# Patient Record
Sex: Male | Born: 1985 | Race: Black or African American | Hispanic: No | Marital: Single | Smoking: Current every day smoker
Health system: Southern US, Community
[De-identification: ages and names within clinical notes are randomized; demographics above are authoritative.]

## PROBLEM LIST (undated history)

## (undated) HISTORY — PX: WRIST SURGERY: SHX841

---

## 2005-08-16 ENCOUNTER — Emergency Department (HOSPITAL_COMMUNITY): Admission: EM | Admit: 2005-08-16 | Discharge: 2005-08-16 | Payer: Self-pay | Admitting: Emergency Medicine

## 2007-02-17 ENCOUNTER — Emergency Department (HOSPITAL_COMMUNITY): Admission: EM | Admit: 2007-02-17 | Discharge: 2007-02-17 | Payer: Self-pay | Admitting: *Deleted

## 2007-04-15 ENCOUNTER — Emergency Department (HOSPITAL_COMMUNITY): Admission: EM | Admit: 2007-04-15 | Discharge: 2007-04-15 | Payer: Self-pay | Admitting: Emergency Medicine

## 2007-05-10 ENCOUNTER — Emergency Department (HOSPITAL_COMMUNITY): Admission: EM | Admit: 2007-05-10 | Discharge: 2007-05-10 | Payer: Self-pay | Admitting: Emergency Medicine

## 2007-05-19 ENCOUNTER — Ambulatory Visit: Payer: Self-pay | Admitting: Internal Medicine

## 2007-05-28 ENCOUNTER — Emergency Department (HOSPITAL_COMMUNITY): Admission: EM | Admit: 2007-05-28 | Discharge: 2007-05-28 | Payer: Self-pay | Admitting: Emergency Medicine

## 2007-05-30 ENCOUNTER — Ambulatory Visit: Payer: Self-pay | Admitting: *Deleted

## 2007-10-06 ENCOUNTER — Ambulatory Visit: Payer: Self-pay | Admitting: Family Medicine

## 2008-02-08 ENCOUNTER — Emergency Department (HOSPITAL_COMMUNITY): Admission: EM | Admit: 2008-02-08 | Discharge: 2008-02-08 | Payer: Self-pay | Admitting: Emergency Medicine

## 2008-05-15 ENCOUNTER — Ambulatory Visit: Payer: Self-pay | Admitting: Internal Medicine

## 2008-05-15 DIAGNOSIS — F319 Bipolar disorder, unspecified: Secondary | ICD-10-CM | POA: Insufficient documentation

## 2008-05-15 LAB — CONVERTED CEMR LAB
Ketones, urine, test strip: NEGATIVE
Nitrite: NEGATIVE
Specific Gravity, Urine: 1.01
pH: 6

## 2008-05-22 LAB — CONVERTED CEMR LAB
ALT: 16 units/L (ref 0–53)
AST: 18 units/L (ref 0–37)
Alkaline Phosphatase: 61 units/L (ref 39–117)
Basophils Absolute: 0 10*3/uL (ref 0.0–0.1)
Basophils Relative: 0 % (ref 0–1)
CO2: 24 meq/L (ref 19–32)
Calcium: 9.2 mg/dL (ref 8.4–10.5)
Chloride: 103 meq/L (ref 96–112)
Creatinine, Ser: 1.3 mg/dL (ref 0.40–1.50)
Eosinophils Absolute: 0.1 10*3/uL (ref 0.0–0.7)
Eosinophils Relative: 1 % (ref 0–5)
Glucose, Bld: 91 mg/dL (ref 70–99)
HCT: 43.6 % (ref 39.0–52.0)
HDL: 41 mg/dL (ref >39)
MCHC: 35.3 g/dL (ref 30.0–36.0)
MCV: 86.3 fL (ref 78.0–100.0)
Monocytes Relative: 9 % (ref 3–12)
Neutro Abs: 3.1 10*3/uL (ref 1.7–7.7)
Neutrophils Relative %: 57 % (ref 43–77)
RBC: 5.05 M/uL (ref 4.22–5.81)
Total CHOL/HDL Ratio: 3.8
Triglycerides: 286 mg/dL — ABNORMAL HIGH (ref <150)
VLDL: 57 mg/dL — ABNORMAL HIGH (ref 0–40)

## 2009-01-03 ENCOUNTER — Ambulatory Visit: Payer: Self-pay | Admitting: Internal Medicine

## 2009-01-03 DIAGNOSIS — E781 Pure hyperglyceridemia: Secondary | ICD-10-CM

## 2009-01-03 LAB — CONVERTED CEMR LAB
Total CHOL/HDL Ratio: 4.3
Triglycerides: 372 mg/dL — ABNORMAL HIGH (ref <150)

## 2009-01-10 ENCOUNTER — Encounter (INDEPENDENT_AMBULATORY_CARE_PROVIDER_SITE_OTHER): Payer: Self-pay | Admitting: Internal Medicine

## 2010-04-03 ENCOUNTER — Ambulatory Visit: Payer: Self-pay | Admitting: Internal Medicine

## 2010-04-03 LAB — CONVERTED CEMR LAB
AST: 24 units/L (ref 0–37)
BUN: 16 mg/dL (ref 6–23)
Basophils Absolute: 0 10*3/uL (ref 0.0–0.1)
CO2: 25 meq/L (ref 19–32)
Chloride: 104 meq/L (ref 96–112)
Creatinine, Ser: 1.11 mg/dL (ref 0.40–1.50)
Eosinophils Absolute: 0 10*3/uL (ref 0.0–0.7)
GC Probe Amp, Urine: NEGATIVE
HCT: 45 % (ref 39.0–52.0)
HDL: 54 mg/dL (ref >39)
LDL Cholesterol: 71 mg/dL (ref 0–99)
Lymphocytes Relative: 18 % (ref 12–46)
MCV: 86.5 fL (ref 78.0–100.0)
Neutro Abs: 7.4 10*3/uL (ref 1.7–7.7)
Neutrophils Relative %: 77 % (ref 43–77)
Platelets: 269 10*3/uL (ref 150–400)
RDW: 12.4 % (ref 11.5–15.5)
Total Bilirubin: 0.5 mg/dL (ref 0.3–1.2)
Total Protein: 7.2 g/dL (ref 6.0–8.3)
Triglycerides: 190 mg/dL — ABNORMAL HIGH (ref <150)
WBC: 9.6 10*3/uL (ref 4.0–10.5)

## 2010-04-08 ENCOUNTER — Encounter (INDEPENDENT_AMBULATORY_CARE_PROVIDER_SITE_OTHER): Payer: Self-pay | Admitting: Internal Medicine

## 2010-04-14 ENCOUNTER — Encounter (INDEPENDENT_AMBULATORY_CARE_PROVIDER_SITE_OTHER): Payer: Self-pay | Admitting: Internal Medicine

## 2010-04-27 ENCOUNTER — Encounter (INDEPENDENT_AMBULATORY_CARE_PROVIDER_SITE_OTHER): Payer: Self-pay | Admitting: Internal Medicine

## 2010-07-29 ENCOUNTER — Ambulatory Visit: Admit: 2010-07-29 | Payer: Self-pay | Admitting: Internal Medicine

## 2010-08-08 ENCOUNTER — Encounter (INDEPENDENT_AMBULATORY_CARE_PROVIDER_SITE_OTHER): Payer: Self-pay | Admitting: Internal Medicine

## 2010-08-12 NOTE — Letter (Signed)
Summary: REQUESTING RECORDS FROM [facility]  REQUESTING RECORDS FROM [facility]   Imported By: Arta Bruce 04/09/2010 12:42:55  _____________________________________________________________________  External Attachment:    Type:   Image     Comment:   External Document

## 2010-08-12 NOTE — Letter (Signed)
Summary: Lipid Letter  [facility]  [address] [place], [place] [address]   Phone: [phone]  Fax: [phone]    04/27/2010  Horice Carrero [address] [place], [place]  [address]  Dear Ines Bloomer:  We have carefully reviewed your last lipid profile from 04/03/2010 and the results are noted below with a summary of recommendations for lipid management.    Cholesterol:       163     Goal: <200   HDL "good" Cholesterol:   54     Goal: >45   LDL "bad" Cholesterol:   71     Goal: <100   Triglycerides:       190     Goal: <150    Your triglycerides are still slightly high, but much much better than before--keep working on diet and exercise.  The rest of your labs were fine.  All of you STD testing was negative for infection.    TLC Diet (Therapeutic Lifestyle Change): Saturated Fats & Transfatty acids should be kept < 7% of total calories ***Reduce Saturated Fats Polyunstaurated Fat can be up to 10% of total calories Monounsaturated Fat Fat can be up to 20% of total calories Total Fat should be no greater than 25-35% of total calories Carbohydrates should be 50-60% of total calories Protein should be approximately 15% of total calories Fiber should be at least 20-30 grams a day ***Increased fiber may help lower LDL Total Cholesterol should be < 200mg /day Consider adding plant stanol/sterols to diet (example: Benacol spread) ***A higher intake of unsaturated fat may reduce Triglycerides and Increase HDL    Adjunctive Measures (may lower LIPIDS and reduce risk of Heart Attack) include: Aerobic Exercise (20-30 minutes 3-4 times a week) Limit Alcohol Consumption Weight Reduction Aspirin 75-81 mg a day by mouth (if not allergic or contraindicated) Dietary Fiber 20-30 grams a day by mouth     Current Medications: 1)    Seroquel Xr 400 Mg Xr24h-tab (Quetiapine fumarate) .... 2 tabs by mouth q 9 pm on an empty stomach--[facility] ?dr. Mila Homer 2)     Budeprion Xl 300 Mg Xr24h-tab (Bupropion hcl) .Marland Kitchen.. 1 tab by mouth qam ?dr. Mila Homer 3)    Depakote Er 500 Mg Xr24h-tab (Divalproex sodium) .... 3 tabs by mouth at bedtime ? dr. Mila Homer  If you have any questions, please call. We appreciate being able to work with you.   Sincerely,    [facility] Julieanne Manson MD

## 2010-08-12 NOTE — Assessment & Plan Note (Signed)
Summary: TO RENEW MEDS///KT   Vital Signs:  Patient profile:   25 year old male Height:      75 inches Weight:      210 pounds BMI:     26.34 Temp:     97.9 degrees F Pulse rate:   80 / minute Pulse rhythm:   regular Resp:     20 per minute BP sitting:   116 / 80  (left arm)  Vitals Entered By: CMA Student Linzie Collin CC: office visit to renew medications, patients states that he has a lump on bottom of both feet associated with pain Is Patient Diabetic? No Pain Assessment Patient in pain? no       Does patient need assistance? Functional Status Self care Ambulation Normal   CC:  office visit to renew medications and patients states that he has a lump on bottom of both feet associated with pain.  History of Present Illness: 1.  Weight Loss:  has been much more physically active.    2.  Would like to be tested for STDs.  With a new girlfriend.  Not having any symptoms of penile discharge or genital lesions.  Girlfriend was treated previously for what sounds like BV.  3.  Foot abnormality:  Referred to [facility] last year--pt. states he was told the bumps are there secondary to the shoes he was wearing. Pt. cannot recall all of the instructions and sounds like he did not follow any instructions.  No improvement.  Hurts when bearing weight or if pressure on the area.  Current Medications (verified): 1)  Seroquel Xr 400 Mg Xr24h-Tab (Quetiapine Fumarate) .... 2 Tabs By Mouth Q 9 Pm On An Empty Stomach--[facility] ?dr. Mila Homer 2)  Budeprion Xl 300 Mg Xr24h-Tab (Bupropion Hcl) .Marland Kitchen.. 1 Tab By Mouth Qam ?dr. Mila Homer 3)  Depakote Er 500 Mg Xr24h-Tab (Divalproex Sodium) .... 2 Tabs By Mouth At Bedtime ? Dr. Mila Homer  Allergies (verified): No Known Drug Allergies  Physical Exam  General:  NAD--looks healthy Lungs:  Normal respiratory effort, chest expands symmetrically. Lungs are clear to auscultation, no crackles or wheezes. Heart:  Normal rate and regular rhythm. S1  and S2 normal without gallop, murmur, click, rub or other extra sounds. Extremities:  Soft tissue enlargement at mid arch, plantar foot.  No erythema.  May surround a tendon--difficult to tell.  Mildly tender.   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 2 tabs by mouth q 9 pm on an empty stomach--[facility] ?dr. Mila Homer 2)  Budeprion Xl 300 Mg Xr24h-tab (Bupropion hcl) .Marland Kitchen.. 1 tab by mouth qam ?dr. Mila Homer 3)  Depakote Er 500 Mg Xr24h-tab (Divalproex sodium) .... 3 tabs by mouth at bedtime ? dr. Mila Homer  Other Orders: T-Comprehensive Metabolic Panel [id number]) T-CBC w/Diff [id number]) T-Lipid Profile ([id number])  Patient Instructions: 1)  Release of infor from [facility]--was seen there last year., 2)  CPE in 4 months with Dr. Delrae Alfred  Appended Document: TO RENEW MEDS///KT    Clinical Lists Changes  Observations: Added new observation of HIVRAPIDRSLT: negative (04/03/2010 16:02)  Laboratory Results    Other Tests  Rapid HIV: negative

## 2010-08-14 NOTE — Letter (Signed)
Summary: MAILED REQUESTED RECORDS TO [facility]  MAILED REQUESTED RECORDS TO [facility]   Imported By: Arta Bruce 08/08/2010 15:58:50  _____________________________________________________________________  External Attachment:    Type:   Image     Comment:   External Document

## 2010-08-24 ENCOUNTER — Emergency Department (HOSPITAL_COMMUNITY)
Admission: EM | Admit: 2010-08-24 | Discharge: 2010-08-24 | Disposition: A | Discharge status: Home / Self Care | Payer: Medicaid Other | Attending: Emergency Medicine | Admitting: Emergency Medicine

## 2010-08-24 DIAGNOSIS — S61209A Unspecified open wound of unspecified finger without damage to nail, initial encounter: Secondary | ICD-10-CM | POA: Insufficient documentation

## 2010-08-24 DIAGNOSIS — W268XXA Contact with other sharp object(s), not elsewhere classified, initial encounter: Secondary | ICD-10-CM | POA: Insufficient documentation

## 2010-08-24 DIAGNOSIS — F319 Bipolar disorder, unspecified: Secondary | ICD-10-CM | POA: Insufficient documentation

## 2010-08-24 DIAGNOSIS — K219 Gastro-esophageal reflux disease without esophagitis: Secondary | ICD-10-CM | POA: Insufficient documentation

## 2010-08-24 DIAGNOSIS — Y92009 Unspecified place in unspecified non-institutional (private) residence as the place of occurrence of the external cause: Secondary | ICD-10-CM | POA: Insufficient documentation

## 2010-09-23 NOTE — Letter (Signed)
Summary: REQUESTING RECORDS/[facility]/NO RECORDS  REQUESTING RECORDS/[facility]/NO RECORDS   Imported By: Arta Bruce 09/15/2010 09:50:01  _____________________________________________________________________  External Attachment:    Type:   Image     Comment:   External Document

## 2011-04-21 LAB — URINALYSIS, ROUTINE W REFLEX MICROSCOPIC
Bilirubin Urine: NEGATIVE
Specific Gravity, Urine: 1.017
Urobilinogen, UA: 0.2
pH: 6

## 2011-04-21 LAB — BASIC METABOLIC PANEL
CO2: 27
Chloride: 104
Creatinine, Ser: 1.4
GFR calc non Af Amer: >60
Glucose, Bld: 104 — ABNORMAL HIGH
Potassium: 4
Sodium: 136

## 2011-04-21 LAB — DIFFERENTIAL
Basophils Relative: 0
Eosinophils Relative: 2
Monocytes Absolute: 0.5
Monocytes Relative: 8

## 2011-04-21 LAB — CBC
HCT: 39.4
MCHC: 35.2
MCV: 84.9
RDW: 13.2
WBC: 6.3

## 2011-04-22 LAB — RPR: RPR Ser Ql: NONREACTIVE

## 2011-04-23 LAB — URINALYSIS, ROUTINE W REFLEX MICROSCOPIC
Bilirubin Urine: NEGATIVE
Glucose, UA: NEGATIVE
Leukocytes, UA: NEGATIVE
Specific Gravity, Urine: 1.014
Urobilinogen, UA: 1
pH: 6

## 2011-04-23 LAB — URINE MICROSCOPIC-ADD ON

## 2011-04-23 LAB — DIFFERENTIAL
Basophils Relative: 0
Eosinophils Absolute: 0.1
Eosinophils Relative: 1
Lymphocytes Relative: 21
Lymphs Abs: 1.7
Monocytes Absolute: 0.5
Neutro Abs: 5.7

## 2011-04-23 LAB — COMPREHENSIVE METABOLIC PANEL
AST: 21
Alkaline Phosphatase: 59
BUN: 11
Calcium: 9
Chloride: 100
GFR calc Af Amer: >60
GFR calc non Af Amer: >60
Potassium: 3.9

## 2011-04-23 LAB — URINE CULTURE

## 2011-04-23 LAB — CBC
HCT: 42.5
MCHC: 34.3
MCV: 85.7
Platelets: 253
RBC: 4.96

## 2013-03-16 ENCOUNTER — Emergency Department (HOSPITAL_COMMUNITY): Payer: No Typology Code available for payment source

## 2013-03-16 ENCOUNTER — Encounter (HOSPITAL_COMMUNITY): Payer: Self-pay | Admitting: Adult Health

## 2013-03-16 ENCOUNTER — Emergency Department (HOSPITAL_COMMUNITY)
Admission: EM | Admit: 2013-03-16 | Discharge: 2013-03-16 | Disposition: A | Discharge status: Home / Self Care | Payer: No Typology Code available for payment source | Attending: Emergency Medicine | Admitting: Emergency Medicine

## 2013-03-16 DIAGNOSIS — Z79899 Other long term (current) drug therapy: Secondary | ICD-10-CM | POA: Insufficient documentation

## 2013-03-16 DIAGNOSIS — M546 Pain in thoracic spine: Secondary | ICD-10-CM

## 2013-03-16 DIAGNOSIS — Y929 Unspecified place or not applicable: Secondary | ICD-10-CM | POA: Insufficient documentation

## 2013-03-16 DIAGNOSIS — R109 Unspecified abdominal pain: Secondary | ICD-10-CM

## 2013-03-16 DIAGNOSIS — IMO0002 Reserved for concepts with insufficient information to code with codable children: Secondary | ICD-10-CM | POA: Insufficient documentation

## 2013-03-16 DIAGNOSIS — S161XXA Strain of muscle, fascia and tendon at neck level, initial encounter: Secondary | ICD-10-CM

## 2013-03-16 DIAGNOSIS — S3981XA Other specified injuries of abdomen, initial encounter: Secondary | ICD-10-CM | POA: Insufficient documentation

## 2013-03-16 DIAGNOSIS — F17200 Nicotine dependence, unspecified, uncomplicated: Secondary | ICD-10-CM | POA: Insufficient documentation

## 2013-03-16 DIAGNOSIS — Y9389 Activity, other specified: Secondary | ICD-10-CM | POA: Insufficient documentation

## 2013-03-16 DIAGNOSIS — S139XXA Sprain of joints and ligaments of unspecified parts of neck, initial encounter: Secondary | ICD-10-CM | POA: Insufficient documentation

## 2013-03-16 MED ORDER — NAPROXEN 500 MG PO TABS: 500.0000 mg | ORAL_TABLET | Freq: Two times a day (BID) | ORAL | Status: DC

## 2013-03-16 MED ORDER — IOHEXOL 300 MG/ML  SOLN
100.0000 mL | Freq: Once | INTRAMUSCULAR | Status: AC | PRN
  Administered 2013-03-16: 100 mL via INTRAVENOUS

## 2013-03-16 MED ORDER — HYDROCODONE-ACETAMINOPHEN 5-325 MG PO TABS: 1.0000 | ORAL_TABLET | Freq: Four times a day (QID) | ORAL | Status: DC | PRN

## 2013-03-16 MED ORDER — CYCLOBENZAPRINE HCL 10 MG PO TABS: 10.0000 mg | ORAL_TABLET | Freq: Three times a day (TID) | ORAL | Status: DC | PRN
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
## 2013-03-16 NOTE — ED Notes (Signed)
Pt has ride home.

## 2013-03-17 NOTE — ED Provider Notes (Signed)
Medical screening examination/treatment/procedure(s) were performed by non-physician practitioner and as supervising physician I was immediately available for consultation/collaboration.  Flint Melter, MD 03/17/13 [phone]

## 2013-12-24 ENCOUNTER — Emergency Department (HOSPITAL_COMMUNITY)
Admission: EM | Admit: 2013-12-24 | Discharge: 2013-12-24 | Disposition: A | Discharge status: Home / Self Care | Payer: Medicaid Other | Attending: Emergency Medicine | Admitting: Emergency Medicine

## 2013-12-24 ENCOUNTER — Emergency Department (HOSPITAL_COMMUNITY): Payer: Medicaid Other

## 2013-12-24 ENCOUNTER — Encounter (HOSPITAL_COMMUNITY): Payer: Self-pay | Admitting: Emergency Medicine

## 2013-12-24 DIAGNOSIS — Z79899 Other long term (current) drug therapy: Secondary | ICD-10-CM | POA: Insufficient documentation

## 2013-12-24 DIAGNOSIS — L0231 Cutaneous abscess of buttock: Secondary | ICD-10-CM | POA: Insufficient documentation

## 2013-12-24 DIAGNOSIS — F17200 Nicotine dependence, unspecified, uncomplicated: Secondary | ICD-10-CM | POA: Insufficient documentation

## 2013-12-24 DIAGNOSIS — L03317 Cellulitis of buttock: Secondary | ICD-10-CM

## 2013-12-24 LAB — BASIC METABOLIC PANEL
BUN: 12 mg/dL (ref 6–23)
CALCIUM: 9.3 mg/dL (ref 8.4–10.5)
CHLORIDE: 101 meq/L (ref 96–112)
CO2: 26 meq/L (ref 19–32)
Creatinine, Ser: 1.07 mg/dL (ref 0.50–1.35)
GFR calc Af Amer: >90 mL/min (ref >90)
GFR calc non Af Amer: >90 mL/min (ref >90)
Glucose, Bld: 106 mg/dL — ABNORMAL HIGH (ref 70–99)
POTASSIUM: 4.1 meq/L (ref 3.7–5.3)
SODIUM: 140 meq/L (ref 137–147)

## 2013-12-24 LAB — CBC WITH DIFFERENTIAL/PLATELET
BASOS ABS: 0 10*3/uL (ref 0.0–0.1)
Basophils Relative: 0 % (ref 0–1)
EOS PCT: 0 % (ref 0–5)
Eosinophils Absolute: 0 10*3/uL (ref 0.0–0.7)
HCT: 39.9 % (ref 39.0–52.0)
Hemoglobin: 13.7 g/dL (ref 13.0–17.0)
LYMPHS ABS: 1 10*3/uL (ref 0.7–4.0)
LYMPHS PCT: 6 % — AB (ref 12–46)
MCH: 29 pg (ref 26.0–34.0)
MCHC: 34.3 g/dL (ref 30.0–36.0)
MCV: 84.5 fL (ref 78.0–100.0)
Monocytes Absolute: 1.4 10*3/uL — ABNORMAL HIGH (ref 0.1–1.0)
Monocytes Relative: 9 % (ref 3–12)
NEUTROS ABS: 13.5 10*3/uL — AB (ref 1.7–7.7)
NEUTROS PCT: 85 % — AB (ref 43–77)
PLATELETS: 234 10*3/uL (ref 150–400)
RBC: 4.72 MIL/uL (ref 4.22–5.81)
RDW: 12.4 % (ref 11.5–15.5)
WBC: 16 10*3/uL — AB (ref 4.0–10.5)

## 2013-12-24 MED ORDER — MORPHINE SULFATE 4 MG/ML IJ SOLN
4.0000 mg | Freq: Once | INTRAMUSCULAR | Status: DC
  Filled 2013-12-24: qty 1

## 2013-12-24 MED ORDER — SODIUM CHLORIDE 0.9 % IV BOLUS (SEPSIS)
1000.0000 mL | Freq: Once | INTRAVENOUS | Status: AC
  Administered 2013-12-24: 1000 mL via INTRAVENOUS

## 2013-12-24 MED ORDER — CLINDAMYCIN HCL 300 MG PO CAPS
300.0000 mg | ORAL_CAPSULE | Freq: Once | ORAL | Status: AC
  Administered 2013-12-24: 300 mg via ORAL
  Filled 2013-12-24: qty 1

## 2013-12-24 MED ORDER — OXYCODONE-ACETAMINOPHEN 5-325 MG PO TABS
2.0000 | ORAL_TABLET | Freq: Once | ORAL | Status: AC
  Administered 2013-12-24: 2 via ORAL
  Filled 2013-12-24: qty 2

## 2013-12-24 MED ORDER — MORPHINE SULFATE 4 MG/ML IJ SOLN
4.0000 mg | Freq: Once | INTRAMUSCULAR | Status: AC
  Administered 2013-12-24: 4 mg via INTRAVENOUS
  Filled 2013-12-24: qty 1

## 2013-12-24 MED ORDER — OXYCODONE-ACETAMINOPHEN 5-325 MG PO TABS: 2.0000 | ORAL_TABLET | ORAL | Status: DC | PRN

## 2013-12-24 MED ORDER — IOHEXOL 300 MG/ML  SOLN
100.0000 mL | Freq: Once | INTRAMUSCULAR | Status: AC | PRN
  Administered 2013-12-24: 100 mL via INTRAVENOUS

## 2013-12-24 MED ORDER — CLINDAMYCIN HCL 150 MG PO CAPS: 300.0000 mg | ORAL_CAPSULE | Freq: Three times a day (TID) | ORAL | Status: DC

## 2013-12-24 MED ORDER — ONDANSETRON HCL 4 MG/2ML IJ SOLN
4.0000 mg | Freq: Once | INTRAMUSCULAR | Status: AC
  Administered 2013-12-24: 4 mg via INTRAVENOUS
  Filled 2013-12-24: qty 2

## 2013-12-24 NOTE — ED Provider Notes (Signed)
CSN: [id number]     Arrival date & time 12/24/13  0320 History   First MD Initiated Contact with Patient 12/24/13 [phone]     Chief Complaint  Patient presents with  . boil      (Consider location/radiation/quality/duration/timing/severity/associated sxs/prior Treatment) HPI Comments: Patient is a 28 year old male with a past medical history of bipolar disorder who presents with a "boil" on his buttock for 2-3 days. Patient states he first noticed the area when he was showering. The area is painful with a throbbing and severe pain that radiates to his right hip. The pain started gradually and progressively worsened since the onset. Palpation makes the pain worse. No alleviating factors. Patient reports associated fevers.    History reviewed. No pertinent past medical history. History reviewed. No pertinent past surgical history. No family history on file. History  Substance Use Topics  . Smoking status: Current Every Day Smoker    Types: Cigarettes  . Smokeless tobacco: Not on file  . Alcohol Use: No    Review of Systems  Constitutional: Negative for fever, chills and fatigue.  HENT: Negative for trouble swallowing.   Eyes: Negative for visual disturbance.  Respiratory: Negative for shortness of breath.   Cardiovascular: Negative for chest pain and palpitations.  Gastrointestinal: Negative for nausea, vomiting, abdominal pain and diarrhea.  Genitourinary: Negative for dysuria and difficulty urinating.  Musculoskeletal: Negative for arthralgias and neck pain.  Skin: Positive for wound. Negative for color change.  Neurological: Negative for dizziness and weakness.  Psychiatric/Behavioral: Negative for dysphoric mood.      Allergies  Review of patient's allergies indicates no known allergies.  Home Medications   Prior to Admission medications   Medication Sig Start Date End Date Taking? Authorizing Provider  amphetamine-dextroamphetamine (ADDERALL) 10 MG tablet Take 10 mg by  mouth daily. Only takes when going to school Monday- Friday.   Yes Historical Provider, MD  buPROPion (WELLBUTRIN) 100 MG tablet Take 200 mg by mouth daily.   Yes Historical Provider, MD  Valproate Sodium (DEPAKENE PO) Take 400 mg by mouth 2 (two) times daily.    Yes Historical Provider, MD   BP 113/66  Pulse 75  Temp(Src) 99.6 F (37.6 C) (Oral)  Resp 14  SpO2 97% Physical Exam  Nursing note and vitals reviewed. Constitutional: He is oriented to person, place, and time. He appears well-developed and well-nourished. No distress.  HENT:  Head: Normocephalic and atraumatic.  Eyes: Conjunctivae and EOM are normal.  Neck: Normal range of motion.  Cardiovascular: Normal rate and regular rhythm.  Exam reveals no gallop and no friction rub.   No murmur heard. Pulmonary/Chest: Effort normal and breath sounds normal. He has no wheezes. He has no rales. He exhibits no tenderness.  Abdominal: Soft. He exhibits no distension. There is no tenderness. There is no rebound and no guarding.  Genitourinary: Rectum normal.  No abscess appreciated near rectum.   Musculoskeletal: Normal range of motion.  Neurological: He is alert and oriented to person, place, and time. Coordination normal.  Speech is goal-oriented. Moves limbs without ataxia.   Skin: Skin is warm and dry.  Right buttock induration, warmth and tenderness to palpation that extends to his right hip. No fluctuance noted. No drainage noted. There is a small area of broken skin over the right buttock where the patient attempted to drain the area.   Psychiatric: He has a normal mood and affect. His behavior is normal.    ED Course  Procedures (including critical  care time) Labs Review Labs Reviewed  CBC WITH DIFFERENTIAL - Abnormal; Notable for the following:    WBC 16.0 (*)    Neutrophils Relative % 85 (*)    Neutro Abs 13.5 (*)    Lymphocytes Relative 6 (*)    Monocytes Absolute 1.4 (*)    All other components within normal limits   BASIC METABOLIC PANEL - Abnormal; Notable for the following:    Glucose, Bld 106 (*)    All other components within normal limits    Imaging Review Ct Pelvis W Contrast  12/24/2013   CLINICAL DATA:  Evaluate for abscess  EXAM: CT PELVIS WITH CONTRAST  TECHNIQUE: Multidetector CT imaging of the pelvis was performed using the standard protocol following the bolus administration of intravenous contrast.  CONTRAST:  100mL OMNIPAQUE IOHEXOL 300 MG/ML  SOLN  COMPARISON:  03/16/2013  FINDINGS: There is asymmetric skin thickening and subcutaneous fat stranding involving the right gluteal fold, image 52/series 2. There is no underlying abscess.  The urinary bladder appears normal. The prostate gland and seminal vesicles are on unremarkable.  The abdominal aorta appears normal. No lower abdominal adenopathy. Right inguinal lymph node measures 1.3 cm. Review of the visualized bony structures is on unremarkable.  IMPRESSION: 1. Cellulitis involves the right gluteal fold. No underlying abscess. 2. Right inguinal adenopathy, likely reactive.   Electronically Signed   By: Signa Kellaylor  Stroud M.D.   On: 12/24/2013 08:29     EKG Interpretation None      MDM   Final diagnoses:  Cellulitis of buttock, right    6:20 AM Labs pending. Patient will have morphine and zofran for symptoms. CT pelvis pending to rule out deep infection. Patient is tachycardic with a low grade temp on arrival. Vitals stable and patient afebrile currently.   9:12 AM Patient's labs show elevated WBC at 16. CT shows cellulitis of right gluteal fold with no abscess. Vitals stable and patient afebrile. Patient will have first dose of PO clindamycin here. Patient will have additional IV pain medication here. Patient will be discharged with clindamycin and percocet for pain. Patient advised to return with worsening or concerning symptoms.    Emilia BeckKaitlyn Britany Callicott, PA-C 12/24/13 1526

## 2013-12-24 NOTE — ED Notes (Signed)
Boil has been present for 2-3 days

## 2013-12-24 NOTE — ED Notes (Signed)
The pt is c/o a boil near his rectum for 203 days,  Becoming more painful

## 2013-12-24 NOTE — Discharge Instructions (Signed)
Take Clindamycin as directed until gone. Take Percocet as needed for pain. Refer to attached documents for more information. Return to the ED with worsening or concerning symptoms.

## 2013-12-25 NOTE — ED Provider Notes (Signed)
Medical screening examination/treatment/procedure(s) were performed by non-physician practitioner and as supervising physician I was immediately available for consultation/collaboration.   EKG Interpretation None       Olivia Mackielga M Rance Smithson, MD 12/25/13 [phone]

## 2016-02-18 ENCOUNTER — Emergency Department (HOSPITAL_COMMUNITY)
Admission: EM | Admit: 2016-02-18 | Discharge: 2016-02-18 | Disposition: A | Discharge status: Home / Self Care | Payer: Medicare Other | Attending: Emergency Medicine | Admitting: Emergency Medicine

## 2016-02-18 ENCOUNTER — Encounter (HOSPITAL_COMMUNITY): Payer: Self-pay

## 2016-02-18 DIAGNOSIS — H6692 Otitis media, unspecified, left ear: Secondary | ICD-10-CM | POA: Diagnosis not present

## 2016-02-18 DIAGNOSIS — F17210 Nicotine dependence, cigarettes, uncomplicated: Secondary | ICD-10-CM | POA: Diagnosis not present

## 2016-02-18 DIAGNOSIS — H9202 Otalgia, left ear: Secondary | ICD-10-CM | POA: Diagnosis present

## 2016-02-18 MED ORDER — AMOXICILLIN 500 MG PO CAPS: 500.0000 mg | ORAL_CAPSULE | Freq: Three times a day (TID) | ORAL | 0 refills | Status: DC

## 2016-02-18 MED ORDER — IBUPROFEN 800 MG PO TABS: 800.0000 mg | ORAL_TABLET | Freq: Three times a day (TID) | ORAL | 0 refills | Status: DC | PRN

## 2016-02-18 NOTE — ED Triage Notes (Signed)
Sharp left ear pain with muffled hearing after blowing nose hard. Pt states he tried getting ear to pop but was not successful

## 2016-02-18 NOTE — Discharge Instructions (Signed)
Return here as needed. Follow up with a primary doctor. °

## 2016-02-18 NOTE — ED Provider Notes (Signed)
[facility] Provider Note   CSN: [id number] Arrival date & time: 02/18/16  0608  First Provider Contact:  None       History   Chief Complaint Chief Complaint  Patient presents with  . Otalgia    HPI Kevin Gentry is a 30 y.o. male.  HPI Patient presents to the emergency department with left-sided ear pain.  The patient states the symptoms started 1 day ago.  The patient states last night he had worsening pain that woke him from sleep.  Patient states he did not take any medications prior to arrival.  Nothing seems make the condition better or worse.  Patient, states he has had a recent upper respiratory illness. The patient denies chest pain, shortness of breath, headache,blurred vision, neck pain, fever, cough, weakness, numbness, dizziness, anorexia, edema, abdominal pain, nausea, vomiting, diarrhea, rash, back pain, dysuria, hematemesis, bloody stool, near syncope, or syncope. History reviewed. No pertinent past medical history.  Patient Active Problem List   Diagnosis Date Noted  . HYPERTRIGLYCERIDEMIA 01/03/2009  . BIPOLAR DISORDER UNSPECIFIED 05/15/2008    History reviewed. No pertinent surgical history.     Home Medications    Prior to Admission medications   Not on File    Family History No family history on file.  Social History Social History  Substance Use Topics  . Smoking status: Current Every Day Smoker    Types: Cigarettes  . Smokeless tobacco: Never Used  . Alcohol use No     Allergies   Review of patient's allergies indicates no known allergies.   Review of Systems Review of Systems All other systems negative except as documented in the HPI. All pertinent positives and negatives as reviewed in the HPI.  Physical Exam Updated Vital Signs BP (!) 150/105 (BP Location: Right Arm)   Pulse 90   Temp 97.6 F (36.4 C) (Oral)   Resp 16   Ht  (1.88 m)   Wt 102.1 kg   SpO2 98%   BMI 28.89 kg/m   Physical Exam    Constitutional: He is oriented to person, place, and time. He appears well-developed and well-nourished. No distress.  HENT:  Head: Normocephalic and atraumatic.  Right Ear: Hearing, tympanic membrane, external ear and ear canal normal.  Left Ear: Hearing, external ear and ear canal normal. Tympanic membrane is injected, erythematous and bulging. A middle ear effusion is present.  Mouth/Throat: Oropharynx is clear and moist.  Eyes: Pupils are equal, round, and reactive to light.  Neck: Normal range of motion. Neck supple.  Cardiovascular: Normal rate, regular rhythm and normal heart sounds.  Exam reveals no gallop and no friction rub.   No murmur heard. Pulmonary/Chest: Effort normal and breath sounds normal. No respiratory distress. He has no wheezes.  Neurological: He is alert and oriented to person, place, and time. He exhibits normal muscle tone. Coordination normal.  Skin: Skin is warm and dry. No rash noted. No erythema.  Psychiatric: He has a normal mood and affect. His behavior is normal.  Nursing note and vitals reviewed.    ED Treatments / Results  Labs (all labs ordered are listed, but only abnormal results are displayed) Labs Reviewed - No data to display  EKG  EKG Interpretation None       Radiology No results found.  Procedures Procedures (including critical care time)  Medications Ordered in ED Medications - No data to display   Initial Impression / Assessment and Plan / ED Course  I have  reviewed the triage vital signs and the nursing notes.  Pertinent labs & imaging results that were available during my care of the patient were reviewed by me and considered in my medical decision making (see chart for details).  Clinical Course    Patient be treated for left otitis media.  Told return here as needed.  Advised follow-up with his primary care Dr. told to increase his fluid intake and rest as much as possible.  Patient agrees the plan and all questions  were answered Final Clinical Impressions(s) / ED Diagnoses   Final diagnoses:  None    New Prescriptions New Prescriptions   No medications on file     Charlestine NightChristopher Clemence Stillings, PA-C 02/18/16 1617    Tomasita CrumbleAdeleke Oni, MD 02/18/16 [phone]

## 2016-12-24 ENCOUNTER — Encounter (HOSPITAL_COMMUNITY): Payer: Self-pay | Admitting: Emergency Medicine

## 2016-12-24 ENCOUNTER — Emergency Department (HOSPITAL_COMMUNITY)
Admission: EM | Admit: 2016-12-24 | Discharge: 2016-12-24 | Disposition: A | Discharge status: Home / Self Care | Payer: Medicare Other | Attending: Emergency Medicine | Admitting: Emergency Medicine

## 2016-12-24 DIAGNOSIS — F17210 Nicotine dependence, cigarettes, uncomplicated: Secondary | ICD-10-CM | POA: Diagnosis not present

## 2016-12-24 DIAGNOSIS — Z7982 Long term (current) use of aspirin: Secondary | ICD-10-CM | POA: Insufficient documentation

## 2016-12-24 DIAGNOSIS — R369 Urethral discharge, unspecified: Secondary | ICD-10-CM

## 2016-12-24 DIAGNOSIS — Z202 Contact with and (suspected) exposure to infections with a predominantly sexual mode of transmission: Secondary | ICD-10-CM | POA: Diagnosis not present

## 2016-12-24 DIAGNOSIS — R360 Urethral discharge without blood: Secondary | ICD-10-CM | POA: Diagnosis present

## 2016-12-24 LAB — URINALYSIS, ROUTINE W REFLEX MICROSCOPIC
BILIRUBIN URINE: NEGATIVE
GLUCOSE, UA: NEGATIVE mg/dL
HGB URINE DIPSTICK: NEGATIVE
KETONES UR: NEGATIVE mg/dL
LEUKOCYTES UA: NEGATIVE
Nitrite: NEGATIVE
PH: 5 (ref 5.0–8.0)
Protein, ur: NEGATIVE mg/dL
Specific Gravity, Urine: 1.015 (ref 1.005–1.030)

## 2016-12-24 LAB — RAPID HIV SCREEN (HIV 1/2 AB+AG)
HIV 1/2 ANTIBODIES: NONREACTIVE
HIV-1 P24 Antigen - HIV24: NONREACTIVE

## 2016-12-24 MED ORDER — AZITHROMYCIN 250 MG PO TABS
1000.0000 mg | ORAL_TABLET | Freq: Once | ORAL | Status: AC
  Administered 2016-12-24: 1000 mg via ORAL
  Filled 2016-12-24: qty 4

## 2016-12-24 MED ORDER — CEFTRIAXONE SODIUM 250 MG IJ SOLR
250.0000 mg | Freq: Once | INTRAMUSCULAR | Status: AC
  Administered 2016-12-24: 250 mg via INTRAMUSCULAR
  Filled 2016-12-24: qty 250

## 2016-12-24 NOTE — ED Notes (Signed)
Patient is a & o x4.  He understood AVS instructions.  No questions or concerns.

## 2016-12-24 NOTE — ED Triage Notes (Signed)
Patient states that he been having clear penial discharge for 2 months and wants to be checked for STDs. Girlfriend is checking in to be tested as well.

## 2016-12-24 NOTE — ED Provider Notes (Signed)
[facility] Provider Note   CSN: [id number] Arrival date & time: 12/24/16  1733  By signing my name below, I, Rosario Adie, attest that this documentation has been prepared under the direction and in the presence of Audry Pili, PA-C.  Electronically Signed: Rosario Adie, ED Scribe. 12/24/16. 6:50 PM.  History   Chief Complaint Chief Complaint  Patient presents with  . Penile Discharge   The history is provided by the patient. No language interpreter was used.    HPI Comments: Kevin Gentry is a 31 y.o. male who presents to the Emergency Department complaining of intermittent episodes of clear penile discharge beginning approximately two months ago. He has recently been sexually active with two partners and he does not use barrier protection each time. Pt reports that one of his sexual partners has recently been dx'd w/ Trichomonas. He denies testicular swelling/pain, abdominal pain, dysuria, fever, or any other associated symptoms.   History reviewed. No pertinent past medical history.  Patient Active Problem List   Diagnosis Date Noted  . HYPERTRIGLYCERIDEMIA 01/03/2009  . BIPOLAR DISORDER UNSPECIFIED 05/15/2008   Past Surgical History:  Procedure Laterality Date  . WRIST SURGERY      Home Medications    Prior to Admission medications   Medication Sig Start Date End Date Taking? Authorizing Provider  amoxicillin (AMOXIL) 500 MG capsule Take 1 capsule (500 mg total) by mouth 3 (three) times daily. 02/18/16   Lawyer, Cristal Deer, PA-C  ibuprofen (ADVIL,MOTRIN) 800 MG tablet Take 1 tablet (800 mg total) by mouth every 8 (eight) hours as needed. 02/18/16   Charlestine Night, PA-C   Family History No family history on file.  Social History Social History  Substance Use Topics  . Smoking status: Current Every Day Smoker    Types: Cigars  . Smokeless tobacco: Never Used  . Alcohol use No   Allergies   Patient has no known allergies.  Review of  Systems Review of Systems  Constitutional: Negative for fever.  Gastrointestinal: Negative for abdominal pain.  Genitourinary: Positive for discharge. Negative for dysuria, scrotal swelling and testicular pain.   Physical Exam Updated Vital Signs BP 122/82 (BP Location: Left Arm)   Pulse 97   Temp 98.6 F (37 C) (Oral)   Resp 16   Ht 6\' 2"  (1.88 m)   Wt 225 lb (102.1 kg)   SpO2 98%   BMI 28.89 kg/m   Physical Exam  Constitutional: He is oriented to person, place, and time. Vital signs are normal. He appears well-developed and well-nourished. No distress.  HENT:  Head: Normocephalic and atraumatic.  Right Ear: Hearing normal.  Left Ear: Hearing normal.  Eyes: Conjunctivae and EOM are normal. Pupils are equal, round, and reactive to light.  Neck: Normal range of motion. Neck supple.  Cardiovascular: Normal rate and regular rhythm.   Pulmonary/Chest: Effort normal.  Abdominal: He exhibits no distension.  Genitourinary:  Genitourinary Comments: Chaperone present. No scrotal swelling. Non TTP. Minimal urethral discharge.   Musculoskeletal: Normal range of motion.  Neurological: He is alert and oriented to person, place, and time.  Skin: Skin is warm and dry. No pallor.  Psychiatric: He has a normal mood and affect. His speech is normal and behavior is normal. Thought content normal.  Nursing note and vitals reviewed.  ED Treatments / Results  DIAGNOSTIC STUDIES: Oxygen Saturation is 98% on RA, normal by my interpretation.   COORDINATION OF CARE: 6:50 PM-Discussed next steps with pt. Pt verbalized understanding and is  agreeable with the plan.   Labs (all labs ordered are listed, but only abnormal results are displayed) Labs Reviewed  URINALYSIS, ROUTINE W REFLEX MICROSCOPIC  RAPID HIV SCREEN (HIV 1/2 AB+AG)  RPR  GC/CHLAMYDIA PROBE AMP ([place]) NOT AT [facility]   Procedures Procedures   Medications Ordered in ED Medications - No data to display  Initial  Impression / Assessment and Plan / ED Course  I have reviewed the triage vital signs and the nursing notes.  Pertinent labs & imaging results that were available during my care of the patient were reviewed by me and considered in my medical decision making (see chart for details).  I have reviewed and evaluated the relevant laboratory values. I obtained HPI from historian.  ED Course: UA, GC/Chlamydia, Rocephin/Zithromax   Assessment: Patient is afebrile without abdominal tenderness, abdominal pain or painful bowel movements to indicate prostatitis.  No tenderness to palpation of the testes or epididymis to suggest orchitis or epididymitis.  STD cultures obtained including HIV, syphilis, gonorrhea and chlamydia. Patient to be discharged with instructions to follow up with PCP. Discussed importance of using protection when sexually active. Pt understands that they have GC/Chlamydia cultures pending and that they will need to inform all sexual partners if results return positive. Patient has been treated prophylactically with azithromycin and Rocephin. UA unremarkable. At time of discharge, Patient is in no acute distress. Vital Signs are stable. Patient is able to ambulate. Patient able to tolerate PO.    Disposition/Plan:  DC home Additional Verbal discharge instructions given and discussed with patient.  Pt Instructed to f/u with PCP in the next week for evaluation and treatment of symptoms. Return precautions given Pt acknowledges and agrees with plan  Supervising Physician Doug SouJacubowitz, Sam, MD  Final Clinical Impressions(s) / ED Diagnoses   Final diagnoses:  Penile discharge  Exposure to STD   New Prescriptions New Prescriptions   No medications on file   I personally performed the services described in this documentation, which was scribed in my presence. The recorded information has been reviewed and is accurate.    Audry PiliMohr, Rakiya Krawczyk, PA-C 12/24/16 1930    Doug SouJacubowitz, Sam,  MD 12/25/16 [phone]

## 2016-12-24 NOTE — Discharge Instructions (Signed)
Please read and follow all provided instructions.  Your diagnoses today include:  1. Penile discharge   2. Exposure to STD     Tests performed today include: Test for gonorrhea and chlamydia. You will be notified by telephone if you have a positive result. Vital signs. See below for your results today.   Medications:  You were treated for chlamydia (1 gram azithromycin pills) and gonorrhea (250mg  rocephin shot).  Home care instructions:  Read educational materials contained in this packet and follow any instructions provided.   You should tell your partners about your infection and avoid having sex for one week to allow time for the medicine to work.  Follow-up instructions: You should [facility] to be tested for HIV, syphilis, and hepatitis -- all of which can be transmitted by sexual contact. We do not routinely screen for these in the Emergency Department.  STD Testing: [facility] [place] [place] [facility] [address], [place], phone [phone] or [phone]   Monday - Friday, call for an appointment [facility] [place] [address], [place], phone [phone] or [phone]  Monday - Friday, call for an appointment  Return instructions:  Please return to the Emergency Department if you experience worsening symptoms.  Please return if you have any other emergent concerns.  Additional Information:  Your vital signs today were: BP 122/82 (BP Location: Left Arm)    Pulse 97    Temp 98.6 F (37 C) (Oral)    Resp 16    Ht 6\' 2"  (1.88 m)    Wt 102.1 kg (225 lb)    SpO2 98%    BMI 28.89 kg/m  If your blood pressure (BP) was elevated above 135/85 this visit, please have this repeated by your doctor within one month. --------------

## 2016-12-25 LAB — GC/CHLAMYDIA PROBE AMP (~~LOC~~) NOT AT ARMC
CHLAMYDIA, DNA PROBE: NEGATIVE
NEISSERIA GONORRHEA: NEGATIVE

## 2016-12-25 LAB — RPR: RPR Ser Ql: NONREACTIVE

## 2017-12-05 ENCOUNTER — Encounter (HOSPITAL_COMMUNITY): Payer: Self-pay | Admitting: Emergency Medicine

## 2017-12-05 ENCOUNTER — Emergency Department (HOSPITAL_COMMUNITY)
Admission: EM | Admit: 2017-12-05 | Discharge: 2017-12-05 | Disposition: A | Discharge status: Home / Self Care | Payer: Medicare Other | Attending: Emergency Medicine | Admitting: Emergency Medicine

## 2017-12-05 ENCOUNTER — Other Ambulatory Visit: Payer: Self-pay

## 2017-12-05 DIAGNOSIS — Z5321 Procedure and treatment not carried out due to patient leaving prior to being seen by health care provider: Secondary | ICD-10-CM | POA: Diagnosis not present

## 2017-12-05 DIAGNOSIS — H5789 Other specified disorders of eye and adnexa: Secondary | ICD-10-CM | POA: Insufficient documentation

## 2017-12-05 NOTE — ED Triage Notes (Signed)
Pt. Stated, My eyes are irritated x 2 days and painful

## 2017-12-07 ENCOUNTER — Encounter (HOSPITAL_COMMUNITY): Payer: Self-pay | Admitting: Emergency Medicine

## 2017-12-07 ENCOUNTER — Ambulatory Visit (HOSPITAL_COMMUNITY)
Admission: EM | Admit: 2017-12-07 | Discharge: 2017-12-07 | Disposition: A | Discharge status: Home / Self Care | Payer: Medicare Other | Attending: Family Medicine | Admitting: Family Medicine

## 2017-12-07 DIAGNOSIS — H1031 Unspecified acute conjunctivitis, right eye: Secondary | ICD-10-CM

## 2017-12-07 DIAGNOSIS — H00012 Hordeolum externum right lower eyelid: Secondary | ICD-10-CM

## 2017-12-07 MED ORDER — OFLOXACIN 0.3 % OP SOLN: 1.0000 [drp] | Freq: Four times a day (QID) | OPHTHALMIC | 0 refills | Status: AC

## 2017-12-07 MED ORDER — OFLOXACIN 0.3 % OP SOLN: 1.0000 [drp] | Freq: Four times a day (QID) | OPHTHALMIC | 0 refills | Status: DC

## 2017-12-07 NOTE — ED Provider Notes (Signed)
[facility]    CSN: [id number] Arrival date & time: 12/07/17  1857     History   Chief Complaint Chief Complaint  Patient presents with  . Eye Pain    HPI Kevin Gentry is a 32 y.o. male.   32 year old male comes in for 2-week history of right eye pain and redness.  States has had lower eyelid swelling without erythema or increased warmth.  States that the swelling location, it is also itching.  Has been waking up with eye crusting in the morning.  Denies vision changes, photophobia.  Denies URI symptoms such as cough, congestion, sore throat.  Denies seasonal allergies, sneezing.  He does wear glasses, but denies contact lens use.     History reviewed. No pertinent past medical history.  Patient Active Problem List   Diagnosis Date Noted  . HYPERTRIGLYCERIDEMIA 01/03/2009  . BIPOLAR DISORDER UNSPECIFIED 05/15/2008    Past Surgical History:  Procedure Laterality Date  . WRIST SURGERY         Home Medications    Prior to Admission medications   Medication Sig Start Date End Date Taking? Authorizing Provider  amoxicillin (AMOXIL) 500 MG capsule Take 1 capsule (500 mg total) by mouth 3 (three) times daily. 02/18/16   Lawyer, Cristal Deer, PA-C  ibuprofen (ADVIL,MOTRIN) 800 MG tablet Take 1 tablet (800 mg total) by mouth every 8 (eight) hours as needed. 02/18/16   Lawyer, Cristal Deer, PA-C  ofloxacin (OCUFLOX) 0.3 % ophthalmic solution Place 1 drop into the right eye 4 (four) times daily for 7 days. 12/07/17 12/14/17  Belinda Fisher, PA-C    Family History History reviewed. No pertinent family history.  Social History Social History   Tobacco Use  . Smoking status: Current Every Day Smoker    Types: Cigars  . Smokeless tobacco: Never Used  Substance Use Topics  . Alcohol use: Yes  . Drug use: No     Allergies   Patient has no known allergies.   Review of Systems Review of Systems  Reason unable to perform ROS: See HPI as above.     Physical  Exam Triage Vital Signs ED Triage Vitals [12/07/17 1949]  Enc Vitals Group     BP 135/87     Pulse Rate 97     Resp 18     Temp 98.6 F (37 C)     Temp Source Oral     SpO2 98 %     Weight      Height      Head Circumference      Peak Flow      Pain Score      Pain Loc      Pain Edu?      Excl. in GC?    No data found.  Updated Vital Signs BP 135/87 (BP Location: Left Arm)   Pulse 97   Temp 98.6 F (37 C) (Oral)   Resp 18   SpO2 98%   Visual Acuity (without glasses) Right Eye Distance: 20/50 Left Eye Distance: 20/50 Bilateral Distance: 20/30  Right Eye Near:   Left Eye Near:    Bilateral Near:     Physical Exam  Constitutional: He is oriented to person, place, and time. He appears well-developed and well-nourished. No distress.  HENT:  Head: Normocephalic and atraumatic.  Eyes: Pupils are equal, round, and reactive to light. EOM are normal. Right eye exhibits hordeolum (lateral lower). Right conjunctiva is injected. Left conjunctiva is not injected.  No  photophobia on exam.  Neck: Normal range of motion. Neck supple.  Neurological: He is alert and oriented to person, place, and time.  Skin: Skin is warm and dry.    UC Treatments / Results  Labs (all labs ordered are listed, but only abnormal results are displayed) Labs Reviewed - No data to display  EKG None  Radiology No results found.  Procedures Procedures (including critical care time)  Medications Ordered in UC Medications - No data to display  Initial Impression / Assessment and Plan / UC Course  I have reviewed the triage vital signs and the nursing notes.  Pertinent labs & imaging results that were available during my care of the patient were reviewed by me and considered in my medical decision making (see chart for details).    Will treat for bacterial conjunctivitis with ofloxacin given symptoms.  Exam also shows right lower lid hordeolum.  Warm compresses, lid scrubs as directed.   Return precautions given.  Patient expresses understanding and agrees to plan.  Final Clinical Impressions(s) / UC Diagnoses   Final diagnoses:  Acute conjunctivitis of right eye, unspecified acute conjunctivitis type  Hordeolum externum of right lower eyelid    ED Prescriptions    Medication Sig Dispense Auth. Provider   ofloxacin (OCUFLOX) 0.3 % ophthalmic solution  (Status: Discontinued) Place 1 drop into the right eye 4 (four) times daily for 7 days. 1.4 mL Yu, Amy V, PA-C   ofloxacin (OCUFLOX) 0.3 % ophthalmic solution Place 1 drop into the right eye 4 (four) times daily for 7 days. 1.4 mL Threasa Alpha, [place] 12/07/17 2029

## 2017-12-07 NOTE — Discharge Instructions (Signed)
Use ofloxacin eyedrops as directed on right eye. Lid scrubs and warm compresses as directed. Monitor for any worsening of symptoms, changes in vision, sensitivity to light, eye swelling, painful eye movement, follow up with ophthalmology for further evaluation.   

## 2017-12-07 NOTE — ED Triage Notes (Signed)
Pt here for eye redness with right eye worse than left

## 2018-03-11 ENCOUNTER — Encounter (HOSPITAL_COMMUNITY): Payer: Self-pay | Admitting: Family Medicine

## 2018-03-11 ENCOUNTER — Ambulatory Visit (HOSPITAL_COMMUNITY)
Admission: EM | Admit: 2018-03-11 | Discharge: 2018-03-11 | Disposition: A | Discharge status: Home / Self Care | Payer: Medicare Other | Attending: Family Medicine | Admitting: Family Medicine

## 2018-03-11 DIAGNOSIS — R109 Unspecified abdominal pain: Secondary | ICD-10-CM

## 2018-03-11 LAB — POCT URINALYSIS DIP (DEVICE)
BILIRUBIN URINE: NEGATIVE
GLUCOSE, UA: NEGATIVE mg/dL
Hgb urine dipstick: NEGATIVE
Ketones, ur: NEGATIVE mg/dL
Leukocytes, UA: NEGATIVE
Nitrite: NEGATIVE
PROTEIN: NEGATIVE mg/dL
SPECIFIC GRAVITY, URINE: 1.025 (ref 1.005–1.030)
Urobilinogen, UA: 0.2 mg/dL (ref 0.0–1.0)
pH: 5 (ref 5.0–8.0)

## 2018-03-11 MED ORDER — DICLOFENAC SODIUM 75 MG PO TBEC: 75.0000 mg | DELAYED_RELEASE_TABLET | Freq: Two times a day (BID) | ORAL | 0 refills | Status: DC

## 2018-03-11 MED ORDER — CYCLOBENZAPRINE HCL 5 MG PO TABS: 5.0000 mg | ORAL_TABLET | Freq: Every day | ORAL | 0 refills | Status: DC

## 2018-03-11 NOTE — ED Triage Notes (Signed)
Pt presents with complaints of right flank pain that started today. Denies urinary symptoms. Worse with movement.

## 2018-03-11 NOTE — ED Provider Notes (Signed)
[facility]    CSN: [id number] Arrival date & time: 03/11/18  1705     History   Chief Complaint Chief Complaint  Patient presents with  . Flank Pain    HPI Kevin Gentry is a 32 y.o. male.   This is a 32 year old man who was brought in by his father because of acute right flank pain.  He says the pain began at about 330 this afternoon after he sneezed.  He says it now hurts to bend or twist his back, and the pain is located on the lateral lower rib margin of his trunk.  Patient said no fever, dysuria, or frequency.  He has had a history of kidney stone in the past.     History reviewed. No pertinent past medical history.  Patient Active Problem List   Diagnosis Date Noted  . HYPERTRIGLYCERIDEMIA 01/03/2009  . BIPOLAR DISORDER UNSPECIFIED 05/15/2008    Past Surgical History:  Procedure Laterality Date  . WRIST SURGERY         Home Medications    Prior to Admission medications   Medication Sig Start Date End Date Taking? Authorizing Provider  amoxicillin (AMOXIL) 500 MG capsule Take 1 capsule (500 mg total) by mouth 3 (three) times daily. 02/18/16   Lawyer, Cristal Deerhristopher, PA-C  ibuprofen (ADVIL,MOTRIN) 800 MG tablet Take 1 tablet (800 mg total) by mouth every 8 (eight) hours as needed. 02/18/16   Charlestine NightLawyer, Christopher, PA-C    Family History Family History  Problem Relation Age of Onset  . Healthy Mother   . Healthy Father     Social History Social History   Tobacco Use  . Smoking status: Current Every Day Smoker    Types: Cigars  . Smokeless tobacco: Never Used  Substance Use Topics  . Alcohol use: Yes  . Drug use: No     Allergies   Patient has no known allergies.   Review of Systems Review of Systems  Constitutional: Negative.   HENT: Negative.   Respiratory: Negative.   Cardiovascular: Negative.   Gastrointestinal: Negative.   Genitourinary: Positive for flank pain. Negative for dysuria, frequency and urgency.  Musculoskeletal:  Negative for back pain.  Neurological: Negative.      Physical Exam Triage Vital Signs ED Triage Vitals [03/11/18 1746]  Enc Vitals Group     BP 123/86     Pulse Rate 92     Resp 18     Temp 98.1 F (36.7 C)     Temp src      SpO2 100 %     Weight      Height      Head Circumference      Peak Flow      Pain Score 8     Pain Loc      Pain Edu?      Excl. in GC?    No data found.  Updated Vital Signs BP 123/86   Pulse 92   Temp 98.1 F (36.7 C)   Resp 18   SpO2 100%   Physical Exam  Constitutional: He appears well-developed and well-nourished.  HENT:  Head: Normocephalic.  Right Ear: External ear normal.  Left Ear: External ear normal.  Eyes: Pupils are equal, round, and reactive to light. Conjunctivae are normal.  Neck: Normal range of motion. Neck supple.  Pulmonary/Chest: Effort normal.  Abdominal: Soft.  Musculoskeletal:  Patient is tender inferior to the 12th rib on the right and the lateral aspect of his  trunk.  Skin: Skin is warm and dry. No rash noted.  Psychiatric: He has a normal mood and affect.  Nursing note and vitals reviewed.    UC Treatments / Results  Labs (all labs ordered are listed, but only abnormal results are displayed) Labs Reviewed  POCT URINALYSIS DIP (DEVICE)    EKG None  Radiology No results found.  Procedures Procedures (including critical care time)  Medications Ordered in UC Medications - No data to display  Initial Impression / Assessment and Plan / UC Course  I have reviewed the triage vital signs and the nursing notes.  Pertinent labs & imaging results that were available during my care of the patient were reviewed by me and considered in my medical decision making (see chart for details).    Final Clinical Impressions(s) / UC Diagnoses   Final diagnoses:  None   Discharge Instructions   None    ED Prescriptions    None     Controlled Substance Prescriptions [place] Controlled Substance Registry  consulted? Not Applicable   Elvina Sidle, MD 03/11/18 1815

## 2018-03-11 NOTE — Discharge Instructions (Addendum)
The urine test is normal.  Your pain is consistent with a pulled muscle which usually takes 3 days to resolve.  If pain seems to be worsening, or if it fails to resolve, please come back for further evaluation.

## 2018-10-23 ENCOUNTER — Emergency Department (HOSPITAL_COMMUNITY)
Admission: EM | Admit: 2018-10-23 | Discharge: 2018-10-23 | Disposition: A | Discharge status: Home / Self Care | Payer: Medicare Other | Attending: Emergency Medicine | Admitting: Emergency Medicine

## 2018-10-23 ENCOUNTER — Emergency Department (HOSPITAL_COMMUNITY): Payer: Medicare Other

## 2018-10-23 ENCOUNTER — Other Ambulatory Visit: Payer: Self-pay

## 2018-10-23 ENCOUNTER — Encounter (HOSPITAL_COMMUNITY): Payer: Self-pay | Admitting: *Deleted

## 2018-10-23 DIAGNOSIS — F17210 Nicotine dependence, cigarettes, uncomplicated: Secondary | ICD-10-CM | POA: Insufficient documentation

## 2018-10-23 DIAGNOSIS — K0889 Other specified disorders of teeth and supporting structures: Secondary | ICD-10-CM | POA: Diagnosis not present

## 2018-10-23 LAB — BASIC METABOLIC PANEL
Anion gap: 12 (ref 5–15)
BUN: 8 mg/dL (ref 6–20)
CO2: 24 mmol/L (ref 22–32)
Calcium: 9.4 mg/dL (ref 8.9–10.3)
Chloride: 99 mmol/L (ref 98–111)
Creatinine, Ser: 1.25 mg/dL — ABNORMAL HIGH (ref 0.61–1.24)
GFR calc Af Amer: >60 mL/min (ref >60)
GFR calc non Af Amer: >60 mL/min (ref >60)
Glucose, Bld: 105 mg/dL — ABNORMAL HIGH (ref 70–99)
Potassium: 3.6 mmol/L (ref 3.5–5.1)
Sodium: 135 mmol/L (ref 135–145)

## 2018-10-23 LAB — CBC WITH DIFFERENTIAL/PLATELET
Abs Immature Granulocytes: 0.06 10*3/uL (ref 0.00–0.07)
Basophils Absolute: 0 10*3/uL (ref 0.0–0.1)
Basophils Relative: 0 %
Eosinophils Absolute: 0.1 10*3/uL (ref 0.0–0.5)
Eosinophils Relative: 1 %
HCT: 44.1 % (ref 39.0–52.0)
Hemoglobin: 14.7 g/dL (ref 13.0–17.0)
Immature Granulocytes: 0 %
Lymphocytes Relative: 6 %
Lymphs Abs: 0.8 10*3/uL (ref 0.7–4.0)
MCH: 28.5 pg (ref 26.0–34.0)
MCHC: 33.3 g/dL (ref 30.0–36.0)
MCV: 85.5 fL (ref 80.0–100.0)
Monocytes Absolute: 0.4 10*3/uL (ref 0.1–1.0)
Monocytes Relative: 3 %
Neutro Abs: 12.7 10*3/uL — ABNORMAL HIGH (ref 1.7–7.7)
Neutrophils Relative %: 90 %
Platelets: 316 10*3/uL (ref 150–400)
RBC: 5.16 MIL/uL (ref 4.22–5.81)
RDW: 13 % (ref 11.5–15.5)
WBC: 14.1 10*3/uL — ABNORMAL HIGH (ref 4.0–10.5)
nRBC: 0 % (ref 0.0–0.2)

## 2018-10-23 MED ORDER — IOHEXOL 300 MG/ML  SOLN
75.0000 mL | Freq: Once | INTRAMUSCULAR | Status: AC | PRN
  Administered 2018-10-23: 12:00:00 75 mL via INTRAVENOUS

## 2018-10-23 MED ORDER — KETOROLAC TROMETHAMINE 30 MG/ML IJ SOLN
30.0000 mg | Freq: Once | INTRAMUSCULAR | Status: AC
  Administered 2018-10-23: 10:00:00 30 mg via INTRAMUSCULAR
  Filled 2018-10-23: qty 1

## 2018-10-23 MED ORDER — CLINDAMYCIN HCL 150 MG PO CAPS: 150.0000 mg | ORAL_CAPSULE | Freq: Four times a day (QID) | ORAL | 0 refills | Status: DC

## 2018-10-23 NOTE — ED Triage Notes (Addendum)
PT went to his dentist 1 week ago for dental pain. Pt reports he was given anti-bx which he finished. Pt here today for the same dental pain . PT is worried about swelling to RT side of neck.

## 2018-10-23 NOTE — ED Notes (Signed)
Pt returned from CT °

## 2018-10-23 NOTE — ED Notes (Signed)
Patient verbalizes understanding of discharge instructions. Opportunity for questioning and answering were provided.  patient discharged from ED.  

## 2018-10-23 NOTE — ED Provider Notes (Signed)
[facility] EMERGENCY DEPARTMENT Provider Note   CSN: [id number] Arrival date & time: 10/23/18  0907  History   Chief Complaint Chief Complaint  Patient presents with  . Dental Pain    HPI Kevin Gentry is a 33 y.o. male with a PMH of hypertriglyceridemia and Bipolar disorder presenting with constant right lower dental pain onset 1 week ago. Patient describes pain as an ache and states biting makes pain worse. Patient reports difficulty swallowing. Patient reports pain radiates to right aspect of neck and reports mild right side facial edema. Patient reports taking ibuprofen a few days ago with partial relief. Patient states he saw his dentist 1 week ago and was prescribed penicillin. Patient states he finished antibiotics, but symptoms have not improved. Patient states he had another dental appointment for a tooth extraction for a broken tooth, but missed the appointment. Patient denies fever, shortness of breath, drooling, nausea, vomiting, or abdominal pain. Patient denies difficulty moving neck. Patient reports occasional alcohol use and daily tobacco use. Patient denies drug use. Patient denies a history of dental problems in the past.      HPI  History reviewed. No pertinent past medical history.  Patient Active Problem List   Diagnosis Date Noted  . HYPERTRIGLYCERIDEMIA 01/03/2009  . BIPOLAR DISORDER UNSPECIFIED 05/15/2008    Past Surgical History:  Procedure Laterality Date  . WRIST SURGERY          Home Medications    Prior to Admission medications   Medication Sig Start Date End Date Taking? Authorizing Provider  cyclobenzaprine (FLEXERIL) 5 MG tablet Take 1 tablet (5 mg total) by mouth at bedtime. 03/11/18   Elvina Sidle, MD  diclofenac (VOLTAREN) 75 MG EC tablet Take 1 tablet (75 mg total) by mouth 2 (two) times daily. 03/11/18   Elvina Sidle, MD    Family History Family History  Problem Relation Age of Onset  . Healthy Mother   .  Healthy Father     Social History Social History   Tobacco Use  . Smoking status: Current Every Day Smoker    Types: Cigars  . Smokeless tobacco: Never Used  Substance Use Topics  . Alcohol use: Yes  . Drug use: No     Allergies   Patient has no known allergies.   Review of Systems Review of Systems  Constitutional: Negative for chills, diaphoresis and fever.  HENT: Positive for dental problem, facial swelling and trouble swallowing. Negative for congestion, ear pain, rhinorrhea, sore throat and voice change.   Eyes: Negative for pain.  Respiratory: Negative for cough and shortness of breath.   Cardiovascular: Negative for chest pain.  Gastrointestinal: Negative for abdominal pain, nausea and vomiting.  Endocrine: Negative for cold intolerance and heat intolerance.  Musculoskeletal: Positive for neck pain. Negative for neck stiffness.  Skin: Negative for color change and rash.  Allergic/Immunologic: Negative for immunocompromised state.  Neurological: Negative for weakness, numbness and headaches.  Hematological: Negative for adenopathy.    Physical Exam Updated Vital Signs BP 140/80 (BP Location: Right Arm)   Pulse (!) 109   Temp 99.6 F (37.6 C) (Oral)   Resp 16   Ht 6' 2.5" (1.892 m)   Wt 117.9 kg   SpO2 97%   BMI 32.94 kg/m   Physical Exam  Physical Exam  Constitutional: Pt appears well-developed and well-nourished.  HENT:  Head: Normocephalic.  Right Ear: Tympanic membrane, external ear and ear canal normal.  Left Ear: Tympanic membrane, external ear and  ear canal normal.  Nose: Nose normal. Right sinus exhibits no maxillary sinus tenderness and no frontal sinus tenderness. Left sinus exhibits no maxillary sinus tenderness and no frontal sinus tenderness.  Mouth/Throat: Uvula is midline, oropharynx is clear and moist and mucous membranes are normal. No oral lesions. Abnormal dentition. Dental Caries present. Right lower 3rd molar is broken. No uvula  swelling or lacerations. No oropharyngeal exudate, posterior oropharyngeal edema, posterior oropharyngeal erythema or tonsillar abscesses.  Mild gingival swelling. Small area of fluctuance noted consistent with an abscess. No signs of Ludwig's angina noted.  Eyes: Conjunctivae are normal. Pupils are equal, round, and reactive to light. Right eye exhibits no discharge. Left eye exhibits no discharge.  Neck: Normal range of motion. Neck supple. No stridor. Handling secretions without difficulty. No nuchal rigidity. Mild right sided cervical lymphadenopathy and edema. Cardiovascular: tachycardic, regular rhythm. Pulmonary/Chest: Effort normal. No respiratory distress. Equal chest rise. Abdominal: Abdomen is soft and non tender. Pt exhibits no distension.  Lymphadenopathy: Pt has cervical adenopathy.  Neurological: Pt is alert.  Skin: Skin is warm and dry.  Psychiatric: Pt has a normal affect. Patient appears anxious. Nursing note and vitals reviewed.   ED Treatments / Results  Labs (all labs ordered are listed, but only abnormal results are displayed) Labs Reviewed  CBC WITH DIFFERENTIAL/PLATELET - Abnormal; Notable for the following components:      Result Value   WBC 14.1 (*)    Neutro Abs 12.7 (*)    All other components within normal limits  BASIC METABOLIC PANEL - Abnormal; Notable for the following components:   Glucose, Bld 105 (*)    Creatinine, Ser 1.25 (*)    All other components within normal limits    EKG None  Radiology Ct Soft Tissue Neck W Contrast  Result Date: 10/23/2018 CLINICAL DATA:  Dental pain and difficulty swallowing. Dysphagia. Right-sided facial swelling EXAM: CT NECK WITH CONTRAST TECHNIQUE: Multidetector CT imaging of the neck was performed using the standard protocol following the bolus administration of intravenous contrast. CONTRAST:  75mL OMNIPAQUE IOHEXOL 300 MG/ML  SOLN COMPARISON:  None. FINDINGS: Pharynx and larynx: Normal. No mass or swelling.  Salivary glands: No inflammation, mass, or stone. Thyroid: Negative Lymph nodes: 11 mm right level 2 lymph node. Additional small cervical lymph nodes bilaterally. No pathologic adenopathy in the neck. Vascular: Normal vascular enhancement. Limited intracranial: Negative Visualized orbits: Negative Mastoids and visualized paranasal sinuses: Negative Skeleton: Caries right lower third molar. Small lucency surrounding the posterior root of the right lower third molar may represent a small periapical abscess. No soft tissue abscess around the mandible. Minimal subcutaneous soft tissue swelling right submandibular region. Upper chest: Negative Other: None IMPRESSION: Caries right lower third molar with small periapical abscess. No soft tissue abscess. Minimal subcutaneous swelling in the region may represent cellulitis Electronically Signed   By: Marlan Palau M.D.   On: 10/23/2018 11:59    Procedures Procedures (including critical care time)  Medications Ordered in ED Medications  ketorolac (TORADOL) 30 MG/ML injection 30 mg (30 mg Intramuscular Given 10/23/18 0956)  iohexol (OMNIPAQUE) 300 MG/ML solution 75 mL (75 mLs Intravenous Contrast Given 10/23/18 1131)     Initial Impression / Assessment and Plan / ED Course  I have reviewed the triage vital signs and the nursing notes.  Pertinent labs & imaging results that were available during my care of the patient were reviewed by me and considered in my medical decision making (see chart for details).  Clinical Course as  of Oct 23 1231  Sun Oct 23, 2018  1024 Leukocytosis noted at 14.1.  WBC(!): 14.1 [AH]  1224 Caries right lower third molar with small periapical abscess. No soft tissue abscess. Minimal subcutaneous swelling in the region may represent cellulitis    CT Soft Tissue Neck W Contrast [AH]    Clinical Course User Index [AH] Leretha DykesHernandez, Aubery Date P, PA-C      Patient with toothache.  Small gross abscess noted, but area is not large  enough to warrant incision and drainage. Patient did have neck pain and reported difficulty swallowing. Ordered CT soft tissue of neck. CT revealed a small periapical abscess and minimal subcutaneous swelling in the region. Patient is swallowing secretions and driving fluids without difficulty. Will treat with clindamycin and anti-inflammatories medicine.  Urged patient to follow-up with dentist.  Discussed return precautions with patient. Patient states he understands and agrees with plan.  Findings and plan of care discussed with supervising physician Dr. Madilyn Hookees.  Final Clinical Impressions(s) / ED Diagnoses   Final diagnoses:  Pain, dental    ED Discharge Orders    None       Glade StanfordHernandez, Elvin Mccartin P, PA-C 10/23/18 1238    Tilden Fossaees, Elizabeth, MD 10/23/18 1247

## 2018-10-23 NOTE — Discharge Instructions (Addendum)
You have been seen today for dental pain. Please read and follow all provided instructions.   1. Medications: clindamycin (antibiotic), tylenol/ibuprofen for pain, usual home medications 2. Treatment: rest, drink plenty of fluids 3. Follow Up: Please call and follow up with your dentist. Please follow up with your primary doctor in 2 days for discussion of your diagnoses and further evaluation after today's visit; if you do not have a primary care doctor use the resource guide provided to find one; Please return to the ER for any new or worsening symptoms. Please obtain all of your results from medical records or have your doctors office obtain the results - share them with your doctor - you should be seen at your doctors office. Call today to arrange your follow up.   Take medications as prescribed. Please review all of the medicines and only take them if you do not have an allergy to them. Return to the emergency room for worsening condition or new concerning symptoms. Follow up with your regular doctor. If you don't have a regular doctor use one of the numbers below to establish a primary care doctor.  Please be aware that if you are taking birth control pills, taking other prescriptions, ESPECIALLY ANTIBIOTICS may make the birth control ineffective - if this is the case, either do not engage in sexual activity or use alternative methods of birth control such as condoms until you have finished the medicine and your family doctor says it is OK to restart them. If you are on a blood thinner such as COUMADIN, be aware that any other medicine that you take may cause the coumadin to either work too much, or not enough - you should have your coumadin level rechecked in next 7 days if this is the case.  ?  It is also a possibility that you have an allergic reaction to any of the medicines that you have been prescribed - Everybody reacts differently to medications and while MOST people have no trouble with most  medicines, you may have a reaction such as nausea, vomiting, rash, swelling, shortness of breath. If this is the case, please stop taking the medicine immediately and contact your physician.  ?  You should return to the ER if you develop severe or worsening symptoms.   Emergency Department Resource Guide 1) Find a Doctor and Pay Out of Pocket Although you won't have to find out who is covered by your insurance plan, it is a good idea to ask around and get recommendations. You will then need to call the office and see if the doctor you have chosen will accept you as a new patient and what types of options they offer for patients who are self-pay. Some doctors offer discounts or will set up payment plans for their patients who do not have insurance, but you will need to ask so you aren't surprised when you get to your appointment.  2) Contact Your Local Health Department Not all health departments have doctors that can see patients for sick visits, but many do, so it is worth a call to see if yours does. If you don't know where your local health department is, you can check in your phone book. The CDC also has a tool to help you locate your state's health department, and many state websites also have listings of all of their local health departments.  3) Find a Walk-in Clinic If your illness is not likely to be very severe or complicated, you may  want to try a walk in clinic. These are popping up all over the country in pharmacies, drugstores, and shopping centers. They're usually staffed by nurse practitioners or physician assistants that have been trained to treat common illnesses and complaints. They're usually fairly quick and inexpensive. However, if you have serious medical issues or chronic medical problems, these are probably not your best option.  No Primary Care Doctor: Call Health Connect at  [phone] - they can help you locate a primary care doctor that  accepts your insurance, provides  certain services, etc. Physician Referral Service- [phone]  Chronic Pain Problems: Organization         Address  Phone   Notes  [facility]  [phone] Patients need to be referred by their primary care doctor.   Medication Assistance: Organization         Address  Phone   Notes  [facility] Medication [facility] [address] [place], [place] [address] [phone] --Must be a resident of [facility] -- Must have NO insurance coverage whatsoever (no Medicaid/ Medicare, etc.) -- The pt. MUST have a primary care doctor that directs their care regularly and follows them in the community   [facility]  [phone]   [employer]  [phone]    Agencies that provide inexpensive medical care: Organization         Address  Phone   Notes  [facility] [place]  [phone]   [facility]    [phone]   [facility] [place], [place] [address] [phone]   [place] [address] [place] [phone]   [facility]anned [facility]    [phone]   [facility]    [phone]   [place] [address], [place] Phone:  [phone], Fax:  [phone] Hours of Operation:  9 am - 6 pm, M-F.  Also accepts Medicaid/Medicare and self-pay.  [facility] for [place] [address], [address], [place] Phone: [phone], Fax: [phone]. Hours of Operation:  8:30 am - 5:30 pm, M-F.  Also accepts Medicaid and self-pay.  [facility] [place] [address], [place] Phone: [phone]   [place], [place], [place] [phone], Ext. [phone] Mondays & Thursdays: 7-9 AM.  First 15 patients are seen on a first come, first serve basis.    [place] Providers:  Organization         Address  Phone   Notes  [facility] [address], [place] [phone] Also accepts self-pay patients.  [facility] [address], [place]  [phone]   [place], [address], [place] [phone]   [facility] [address] [place] [phone]   Lucianne Lei [address], [place]   [phone] Only accepts [place] [facility] [place] patients after they have their name applied to their card.   Self-Pay (no insurance) in [facility]:  Organization         Address  Phone   Notes  Sickle Cell Patients, [facility] [place] [phone]   [facility] [place] [phone]  [facility] [place]  [address], [address], [place] [phone]   [facility]/Dr. Osei-Bonsu  [address] [place] or [address], [place] [phone] Phone number for both [place] and [employer] locations is the same.  [facility] [address], [place] [phone]   [facility] [address] [place] or [address] [phone]   [facility] [address], [place] [phone], phone; [phone], fax Sees patients 1st and 3rd Saturday of every month.  Must not qualify for public or private insurance (i.e. Medicaid, Medicare, [employer] Choice, Veterans' Benefits)  Household income should be no more than 200% of the poverty level The clinic cannot treat you if you are pregnant or think you are pregnant  Sexually transmitted diseases are not treated at the clinic.

## 2020-06-18 ENCOUNTER — Emergency Department (HOSPITAL_COMMUNITY)
Admission: EM | Admit: 2020-06-18 | Discharge: 2020-06-18 | Disposition: A | Discharge status: Home / Self Care | Payer: Medicare Other | Attending: Emergency Medicine | Admitting: Emergency Medicine

## 2020-06-18 ENCOUNTER — Other Ambulatory Visit: Payer: Self-pay

## 2020-06-18 ENCOUNTER — Ambulatory Visit (HOSPITAL_COMMUNITY)
Admission: EM | Admit: 2020-06-18 | Discharge: 2020-06-18 | Disposition: A | Discharge status: Home / Self Care | Payer: Medicare Other

## 2020-06-18 ENCOUNTER — Emergency Department (HOSPITAL_COMMUNITY): Payer: Medicare Other

## 2020-06-18 ENCOUNTER — Encounter (HOSPITAL_COMMUNITY): Payer: Self-pay | Admitting: Urgent Care

## 2020-06-18 ENCOUNTER — Encounter (HOSPITAL_COMMUNITY): Payer: Self-pay

## 2020-06-18 DIAGNOSIS — F17290 Nicotine dependence, other tobacco product, uncomplicated: Secondary | ICD-10-CM | POA: Insufficient documentation

## 2020-06-18 DIAGNOSIS — R000 Tachycardia, unspecified: Secondary | ICD-10-CM | POA: Insufficient documentation

## 2020-06-18 DIAGNOSIS — J390 Retropharyngeal and parapharyngeal abscess: Secondary | ICD-10-CM | POA: Insufficient documentation

## 2020-06-18 DIAGNOSIS — Z20822 Contact with and (suspected) exposure to covid-19: Secondary | ICD-10-CM | POA: Diagnosis not present

## 2020-06-18 DIAGNOSIS — J36 Peritonsillar abscess: Secondary | ICD-10-CM

## 2020-06-18 DIAGNOSIS — R221 Localized swelling, mass and lump, neck: Secondary | ICD-10-CM | POA: Diagnosis present

## 2020-06-18 LAB — CBC WITH DIFFERENTIAL/PLATELET
Abs Immature Granulocytes: 0.12 10*3/uL — ABNORMAL HIGH (ref 0.00–0.07)
Basophils Absolute: 0.1 10*3/uL (ref 0.0–0.1)
Basophils Relative: 0 %
Eosinophils Absolute: 0 10*3/uL (ref 0.0–0.5)
Eosinophils Relative: 0 %
HCT: 46.3 % (ref 39.0–52.0)
Hemoglobin: 15.5 g/dL (ref 13.0–17.0)
Immature Granulocytes: 1 %
Lymphocytes Relative: 7 %
Lymphs Abs: 1.3 10*3/uL (ref 0.7–4.0)
MCH: 28.5 pg (ref 26.0–34.0)
MCHC: 33.5 g/dL (ref 30.0–36.0)
MCV: 85.3 fL (ref 80.0–100.0)
Monocytes Absolute: 1.8 10*3/uL — ABNORMAL HIGH (ref 0.1–1.0)
Monocytes Relative: 9 %
Neutro Abs: 15.3 10*3/uL — ABNORMAL HIGH (ref 1.7–7.7)
Neutrophils Relative %: 83 %
Platelets: 382 10*3/uL (ref 150–400)
RBC: 5.43 MIL/uL (ref 4.22–5.81)
RDW: 12.7 % (ref 11.5–15.5)
WBC: 18.6 10*3/uL — ABNORMAL HIGH (ref 4.0–10.5)
nRBC: 0 % (ref 0.0–0.2)

## 2020-06-18 LAB — LACTIC ACID, PLASMA: Lactic Acid, Venous: 1.2 mmol/L (ref 0.5–1.9)

## 2020-06-18 LAB — BASIC METABOLIC PANEL WITH GFR
Anion gap: 12 (ref 5–15)
BUN: 11 mg/dL (ref 6–20)
CO2: 26 mmol/L (ref 22–32)
Calcium: 9.8 mg/dL (ref 8.9–10.3)
Chloride: 101 mmol/L (ref 98–111)
Creatinine, Ser: 1.36 mg/dL — ABNORMAL HIGH (ref 0.61–1.24)
GFR, Estimated: >60 mL/min
Glucose, Bld: 114 mg/dL — ABNORMAL HIGH (ref 70–99)
Potassium: 4 mmol/L (ref 3.5–5.1)
Sodium: 139 mmol/L (ref 135–145)

## 2020-06-18 LAB — I-STAT CHEM 8, ED
BUN: 12 mg/dL (ref 6–20)
Calcium, Ion: 1.15 mmol/L (ref 1.15–1.40)
Chloride: 100 mmol/L (ref 98–111)
Creatinine, Ser: 1.3 mg/dL — ABNORMAL HIGH (ref 0.61–1.24)
Glucose, Bld: 114 mg/dL — ABNORMAL HIGH (ref 70–99)
HCT: 48 % (ref 39.0–52.0)
Hemoglobin: 16.3 g/dL (ref 13.0–17.0)
Potassium: 3.8 mmol/L (ref 3.5–5.1)
Sodium: 140 mmol/L (ref 135–145)
TCO2: 26 mmol/L (ref 22–32)

## 2020-06-18 LAB — RESP PANEL BY RT-PCR (FLU A&B, COVID) ARPGX2
Influenza A by PCR: NEGATIVE
Influenza B by PCR: NEGATIVE
SARS Coronavirus 2 by RT PCR: NEGATIVE

## 2020-06-18 MED ORDER — CLINDAMYCIN HCL 300 MG PO CAPS: 300.0000 mg | ORAL_CAPSULE | Freq: Four times a day (QID) | ORAL | 0 refills | Status: DC

## 2020-06-18 MED ORDER — SODIUM CHLORIDE 0.9 % IV BOLUS
1000.0000 mL | Freq: Once | INTRAVENOUS | Status: AC
  Administered 2020-06-18: 1000 mL via INTRAVENOUS

## 2020-06-18 MED ORDER — IOHEXOL 300 MG/ML  SOLN
75.0000 mL | Freq: Once | INTRAMUSCULAR | Status: AC | PRN
  Administered 2020-06-18: 75 mL via INTRAVENOUS

## 2020-06-18 MED ORDER — DEXAMETHASONE SODIUM PHOSPHATE 10 MG/ML IJ SOLN
10.0000 mg | Freq: Once | INTRAMUSCULAR | Status: AC
  Administered 2020-06-18: 10 mg via INTRAVENOUS
  Filled 2020-06-18: qty 1

## 2020-06-18 MED ORDER — HYDROMORPHONE HCL 1 MG/ML IJ SOLN
1.0000 mg | Freq: Once | INTRAMUSCULAR | Status: AC
  Administered 2020-06-18: 1 mg via INTRAVENOUS
  Filled 2020-06-18: qty 1

## 2020-06-18 MED ORDER — ACETAMINOPHEN 325 MG PO TABS
650.0000 mg | ORAL_TABLET | Freq: Once | ORAL | Status: AC
  Administered 2020-06-18: 650 mg via ORAL
  Filled 2020-06-18: qty 2

## 2020-06-18 MED ORDER — HYDROCODONE-ACETAMINOPHEN 5-325 MG PO TABS: 1.0000 | ORAL_TABLET | Freq: Four times a day (QID) | ORAL | 0 refills | Status: DC | PRN

## 2020-06-18 MED ORDER — CLINDAMYCIN PHOSPHATE 600 MG/50ML IV SOLN
600.0000 mg | Freq: Once | INTRAVENOUS | Status: AC
  Administered 2020-06-18: 600 mg via INTRAVENOUS
  Filled 2020-06-18: qty 50

## 2020-06-18 NOTE — ED Triage Notes (Signed)
Pt reports swelling and pain to Right lateral neck x3 days.

## 2020-06-18 NOTE — Consult Note (Signed)
Reason for Consult: Peritonsillar abscess Referring Physician: Emergency room  Kevin Gentry is an 34 y.o. male.  HPI: Patient with a 3-day history of sore throat isolated to the right side.  He has progressively had difficulty swallowing.  He has definite pain.  He is still able to take liquids.  He has not had a peritonsillar abscess previously.  He does not have repetitive tonsillitis.  He has no breathing difficulties.  History reviewed. No pertinent past medical history.  Past Surgical History:  Procedure Laterality Date  . WRIST SURGERY      Family History  Problem Relation Age of Onset  . Healthy Mother   . Healthy Father     Social History:  reports that he has been smoking cigars. He has never used smokeless tobacco. He reports current alcohol use. He reports that he does not use drugs.  Allergies: No Known Allergies  Medications: I have reviewed the patient's current medications.  Results for orders placed or performed during the hospital encounter of 06/18/20 (from the past 48 hour(s))  CBC with Differential/Platelet     Status: Abnormal   Collection Time: 06/18/20  5:19 PM  Result Value Ref Range   WBC 18.6 (H) 4.0 - 10.5 K/uL   RBC 5.43 4.22 - 5.81 MIL/uL   Hemoglobin 15.5 13.0 - 17.0 g/dL   HCT 58.5 39 - 52 %   MCV 85.3 80.0 - 100.0 fL   MCH 28.5 26.0 - 34.0 pg   MCHC 33.5 30.0 - 36.0 g/dL   RDW 27.7 82.4 - 23.5 %   Platelets 382 150 - 400 K/uL   nRBC 0.0 0.0 - 0.2 %   Neutrophils Relative % 83 %   Neutro Abs 15.3 (H) 1.7 - 7.7 K/uL   Lymphocytes Relative 7 %   Lymphs Abs 1.3 0.7 - 4.0 K/uL   Monocytes Relative 9 %   Monocytes Absolute 1.8 (H) 0.1 - 1.0 K/uL   Eosinophils Relative 0 %   Eosinophils Absolute 0.0 0.0 - 0.5 K/uL   Basophils Relative 0 %   Basophils Absolute 0.1 0.0 - 0.1 K/uL   Immature Granulocytes 1 %   Abs Immature Granulocytes 0.12 (H) 0.00 - 0.07 K/uL    Comment: Performed at [facility], [address] [place], [place]  [address]  Basic metabolic panel     Status: Abnormal   Collection Time: 06/18/20  5:19 PM  Result Value Ref Range   Sodium 139 135 - 145 mmol/L   Potassium 4.0 3.5 - 5.1 mmol/L   Chloride 101 98 - 111 mmol/L   CO2 26 22 - 32 mmol/L   Glucose, Bld 114 (H) 70 - 99 mg/dL    Comment: Glucose reference range applies only to samples taken after fasting for at least 8 hours.   BUN 11 6 - 20 mg/dL   Creatinine, Ser 3.15 (H) 0.61 - 1.24 mg/dL   Calcium 9.8 8.9 - 40.0 mg/dL   GFR, Estimated >86 >76 mL/min    Comment: (NOTE) Calculated using the CKD-EPI Creatinine Equation (2021)    Anion gap 12 5 - 15    Comment: Performed at [facility], [address], [place], [place] [address]  Resp Panel by RT-PCR (Flu A&B, Covid) Nasopharyngeal Swab     Status: None   Collection Time: 06/18/20  5:25 PM   Specimen: Nasopharyngeal Swab; Nasopharyngeal(NP) swabs in vial transport medium  Result Value Ref Range   SARS Coronavirus 2 by  RT PCR NEGATIVE NEGATIVE    Comment: (NOTE) SARS-CoV-2 target nucleic acids are NOT DETECTED.  The SARS-CoV-2 RNA is generally detectable in upper respiratory specimens during the acute phase of infection. The lowest concentration of SARS-CoV-2 viral copies this assay can detect is 138 copies/mL. A negative result does not preclude SARS-Cov-2 infection and should not be used as the sole basis for treatment or other patient management decisions. A negative result may occur with  improper specimen collection/handling, submission of specimen other than nasopharyngeal swab, presence of viral mutation(s) within the areas targeted by this assay, and inadequate number of viral copies(<138 copies/mL). A negative result must be combined with clinical observations, patient history, and epidemiological information. The expected result is Negative.  Fact Sheet for Patients:  BloggerCourse.comhttps://www.fda.gov/media/152166/download  Fact Sheet for Healthcare Providers:   SeriousBroker.ithttps://www.fda.gov/media/152162/download  This test is no t yet approved or cleared by the [place] FDA and  has been authorized for detection and/or diagnosis of SARS-CoV-2 by FDA under an Emergency Use Authorization (EUA). This EUA will remain  in effect (meaning this test can be used) for the duration of the COVID-19 declaration under Section 564(b)(1) of the Act, 21 U.S.C.section 360bbb-3(b)(1), unless the authorization is terminated  or revoked sooner.       Influenza A by PCR NEGATIVE NEGATIVE   Influenza B by PCR NEGATIVE NEGATIVE    Comment: (NOTE) The Xpert Xpress SARS-CoV-2/FLU/RSV plus assay is intended as an aid in the diagnosis of influenza from Nasopharyngeal swab specimens and should not be used as a sole basis for treatment. Nasal washings and aspirates are unacceptable for Xpert Xpress SARS-CoV-2/FLU/RSV testing.  Fact Sheet for Patients: BloggerCourse.comhttps://www.fda.gov/media/152166/download  Fact Sheet for Healthcare Providers: SeriousBroker.ithttps://www.fda.gov/media/152162/download  This test is not yet approved or cleared by the [place] FDA and has been authorized for detection and/or diagnosis of SARS-CoV-2 by FDA under an Emergency Use Authorization (EUA). This EUA will remain in effect (meaning this test can be used) for the duration of the COVID-19 declaration under Section 564(b)(1) of the Act, 21 U.S.C. section 360bbb-3(b)(1), unless the authorization is terminated or revoked.  Performed at [facility], [address] [place], [place] [address]   Lactic acid, plasma     Status: None   Collection Time: 06/18/20  5:50 PM  Result Value Ref Range   Lactic Acid, Venous 1.2 0.5 - 1.9 mmol/L    Comment: Performed at [facility], [address] [place], [place] [address]  I-stat chem 8, ED (not at [facility] or [facility])     Status: Abnormal   Collection Time: 06/18/20  6:05 PM  Result Value Ref Range   Sodium 140 135 - 145 mmol/L   Potassium 3.8 3.5 - 5.1  mmol/L   Chloride 100 98 - 111 mmol/L   BUN 12 6 - 20 mg/dL   Creatinine, Ser 0.981.30 (H) 0.61 - 1.24 mg/dL   Glucose, Bld 119114 (H) 70 - 99 mg/dL    Comment: Glucose reference range applies only to samples taken after fasting for at least 8 hours.   Calcium, Ion 1.15 1.15 - 1.40 mmol/L   TCO2 26 22 - 32 mmol/L   Hemoglobin 16.3 13.0 - 17.0 g/dL   HCT 14.748.0 39 - 52 %    CT Soft Tissue Neck W Contrast  Result Date: 06/18/2020 CLINICAL DATA:  34 year old male with left neck swelling. Sore throat for 3 days now with significant dysphagia, difficulty opening mouth. Mild stridor. Possible abscess. EXAM: CT  NECK WITH CONTRAST TECHNIQUE: Multidetector CT imaging of the neck was performed using the standard protocol following the bolus administration of intravenous contrast. CONTRAST:  53mL OMNIPAQUE IOHEXOL 300 MG/ML  SOLN COMPARISON:  Neck CT 10/23/2018. FINDINGS: Pharynx and larynx: The glottis is closed. The subglottic larynx remains normal. Supraglottic larynx and hypopharynx remarkable for asymmetric right side soft tissue swelling. Superimposed retropharyngeal effusion. Soft tissue swelling and asymmetry becomes more severe at the level of the palatine tonsils, and there is a mildly irregular shaped 22 x 28 x 28 mm abscess of the right palatine tonsil (series 3, image 18). Associated asymmetric right parapharyngeal space stranding. Mild asymmetric soft tissue swelling also at the adenoids on the right. Left parapharyngeal space is normal. Salivary glands: Negative sublingual space. Mild secondary inflammation in the right submandibular space. Left submandibular space and gland are normal. Bilateral parotid glands are within normal limits. Thyroid: Negative. Lymph nodes: Reactive right side cervical lymph nodes, levels 1 through 4. Right side level 2 lymph nodes nearly 2 cm short axis (series 3, image 37). Smaller hyperenhancing right retropharyngeal lymph nodes appear reactive (series 3, image 18). No cystic  or necrotic node. Vascular: Major vascular structures in the neck and at the skull base remain patent including the right IJ. Limited intracranial: Minimally included. Visualized orbits: Not included. Mastoids and visualized paranasal sinuses: Minimally included. Mild right maxillary sinus mucosal thickening. Skeleton: No carious dentition identified. No acute osseous abnormality identified. Upper chest: Negative; small volume residual thymus. IMPRESSION: Positive for a Right Tonsillar Abscess measuring up to 28 mm diameter. Associated widespread swelling of the right pharynx, supraglottic larynx. Small retropharyngeal effusion and secondary inflammation within the right parapharyngeal and submandibular spaces. Reactive right side lymphadenopathy. Electronically Signed   By: Odessa Fleming M.D.   On: 06/18/2020 19:09    ROS Blood pressure 109/67, pulse (!) 119, temperature (!) 100.9 F (38.3 C), temperature source Oral, resp. rate (!) 24, SpO2 100 %. Physical Exam Constitutional:      Appearance: He is obese.  HENT:     Head: Normocephalic.     Nose: Nose normal.     Mouth/Throat:     Comments: He opens his mouth well.  There is obvious erythema of the right soft palate and significant bulging of the right tonsil.  It definitely carries past the midline.  The uvula is edematous.  His tongue looks normal.  He has no swelling in the intraoral cavity.  The posterior pharyngeal wall looks normal.  No neck swelling of significance. Eyes:     Extraocular Movements: Extraocular movements intact.     Pupils: Pupils are equal, round, and reactive to light.  Musculoskeletal:     Cervical back: Normal range of motion and neck supple.  Neurological:     Mental Status: He is alert.     Right peritonsillar abscess-he was informed risk and benefits of the procedure and options were discussed all questions were answered and consent was obtained for a incision and drainage of right peritonsillar abscess.  The area  was sprayed with Cetacaine on the right side and then injected with 1% lidocaine with 1-100,000 epinephrine.  15 blade was used to open up the peritonsillar region.  A tonsil hemostat was then placed into the wound and a significant amount of purulence was expressed with manipulation with the hemostat.  This was suctioned.  There was good hemostasis.  He tolerated the procedure extremely well.  Assessment/Plan: Peritonsillar abscess right-he had a large abscess that was  drained with incision and drainage.  The patient tolerated this very well.  He immediately felt improved.  His swelling of his tonsil area was extensive but it did not appear that his tongue or any portion of his posterior pharynx had any edema.  His CT scan showed a clear airway below the base of tongue as the epiglottis looked normal and airway/glottis was without significant swelling.  He will receive intravenous antibiotics of clindamycin in the emergency room with some steroids.  He then will be discharged on clindamycin to follow-up in the outpatient clinic within the week.  He is to have improvement each day till the third day he should be just about normal otherwise she should follow-up sooner than the week.Suzanna Obey 06/18/2020, 8:35 PM

## 2020-06-18 NOTE — ED Triage Notes (Signed)
Pt brought down here from UC by an RN for further evaluation of peritonsillar abscess. Pt went to UC today 3 days of increase saliva, spitting, eating, drinking and speaking d/t abscess. RN reports audible stridor.

## 2020-06-18 NOTE — ED Triage Notes (Signed)
Pt sore throat for approx 3 days with significant dysphagia, pt spitting 2/2 difficulty drinking. Pt has difficulty opening mouth, speaking, mild stridor noted.  Denies fever, n/v/d, cough, congestion, runny nose, body aches.  Tonsils significantly enlarged, airway narrowed 2/2 edema-Kevin Gentry notified of pt airway status. Kevin Gentry in for BS eval and explained to pt need for him to go to ED STAT for CT of throat/neck, IVF for possible retropharyngeal abscess.  Patient is being discharged from the Urgent Care and sent to the Emergency Department via WC . Per Kevin Gentry, patient is in need of higher level of care due to retropharyngeal abscess, compromised airway. Patient is aware and verbalizes understanding of plan of care.  Vitals:   06/18/20 1437  BP: (!) 142/87  Pulse: (!) 121  Resp: (!) 22  Temp: 98.3 F (36.8 C)  SpO2: 98%

## 2020-06-18 NOTE — ED Provider Notes (Signed)
Patient redirected to the emergency room out of concern for retropharyngeal abscess.  At triage, he was found to have left-sided neck swelling, extreme difficulty opening his mouth and is speaking with 1-2 word sentences barely opening his mouth.  He is also tachycardic and likely dehydrated as he is not been able to eat any solid food or drink fluids for the past 3 days.  Patient verbalizes understanding the risks of an untreated retropharyngeal abscess, contracts for safety and will report to the emergency room now.   Wallis Bamberg, PA-C 06/18/20 1444

## 2020-06-18 NOTE — ED Notes (Signed)
Dr. Byers at bedside.  

## 2020-06-18 NOTE — Progress Notes (Signed)
   06/18/20 2105  TOC ED Mini Assessment  TOC Time spent with patient (minutes): 20  PING Used in TOC Assessment No  Admission or Readmission Diverted Yes  CM received consult for medication assistance. CM reviewed record noted patient to have Medicare, so patient is not eligible for [facility] program. CM met with patient to discuss his health insurance coverage and made him aware that he can take prescriptions to pharmacy and pay a small co-pay patient verbalized understanding teach back done.  No further ED TOC needs identified.

## 2020-06-18 NOTE — Discharge Instructions (Signed)
Take clindamycin as prescribed   Take motrin, tylenol for pain   Take vicodin for severe pain  Call ENT for appointment this week for follow up    Stay hydrated   Return to ER if you have trouble swallowing, dehydration, fever.

## 2020-06-18 NOTE — ED Provider Notes (Signed)
[facility] EMERGENCY DEPARTMENT Provider Note   CSN: [id number] Arrival date & time: 06/18/20  1446     History Chief Complaint  Patient presents with  . Abscess    Kevin Gentry is a 34 y.o. male history of hypertriglyceridemia, bipolar here presenting with right neck swelling and pain .  Patient states that for the last 3 days, he is unable to keep anything down.  He states that even have a hard time tolerating his own secretions he is also concerned about swelling of the right side of his neck.  He was seen at urgent care and there was a concern for retropharyngeal abscess. Patient states that he is vaccinated for Covid.  He denies having previous tonsillectomy or tonsil infections.  The history is provided by the patient.       History reviewed. No pertinent past medical history.  Patient Active Problem List   Diagnosis Date Noted  . HYPERTRIGLYCERIDEMIA 01/03/2009  . BIPOLAR DISORDER UNSPECIFIED 05/15/2008    Past Surgical History:  Procedure Laterality Date  . WRIST SURGERY         Family History  Problem Relation Age of Onset  . Healthy Mother   . Healthy Father     Social History   Tobacco Use  . Smoking status: Current Every Day Smoker    Types: Cigars  . Smokeless tobacco: Never Used  Vaping Use  . Vaping Use: Never used  Substance Use Topics  . Alcohol use: Yes  . Drug use: No    Home Medications Prior to Admission medications   Medication Sig Start Date End Date Taking? Authorizing Provider  cyclobenzaprine (FLEXERIL) 5 MG tablet Take 1 tablet (5 mg total) by mouth at bedtime. Patient not taking: Reported on 06/18/2020 03/11/18   Elvina Sidle, MD  diclofenac (VOLTAREN) 75 MG EC tablet Take 1 tablet (75 mg total) by mouth 2 (two) times daily. Patient not taking: Reported on 06/18/2020 03/11/18   Elvina Sidle, MD    Allergies    Patient has no known allergies.  Review of Systems   Review of Systems  Constitutional:  Positive for fever.  HENT: Positive for sore throat.   All other systems reviewed and are negative.   Physical Exam Updated Vital Signs BP 122/79   Pulse (!) 118   Temp (!) 100.9 F (38.3 C) (Oral)   Resp (!) 22   SpO2 93%   Physical Exam Vitals and nursing note reviewed.  Constitutional:      Comments: Trismus, hot potato voice   HENT:     Head: Normocephalic.     Mouth/Throat:     Comments: He does have swelling at the right peritonsillar area.  I was unable to visualize the uvula since there is significant swelling in that area.  Patient also has trismus.  Patient also has right cervical adenopathy as well as swelling of the right side of his face.  Eyes:     Pupils: Pupils are equal, round, and reactive to light.  Neck:     Comments: Right cervical lymphadenopathy Cardiovascular:     Rate and Rhythm: Regular rhythm. Tachycardia present.     Heart sounds: Normal heart sounds.  Pulmonary:     Effort: Pulmonary effort is normal.     Breath sounds: Normal breath sounds.  Abdominal:     General: Abdomen is flat.     Palpations: Abdomen is soft.  Musculoskeletal:        General: Normal range  of motion.  Skin:    General: Skin is warm.     Capillary Refill: Capillary refill takes less than 2 seconds.  Neurological:     General: No focal deficit present.     Mental Status: He is oriented to person, place, and time.  Psychiatric:        Mood and Affect: Mood normal.        Behavior: Behavior normal.     ED Results / Procedures / Treatments   Labs (all labs ordered are listed, but only abnormal results are displayed) Labs Reviewed  CBC WITH DIFFERENTIAL/PLATELET - Abnormal; Notable for the following components:      Result Value   WBC 18.6 (*)    Neutro Abs 15.3 (*)    Monocytes Absolute 1.8 (*)    Abs Immature Granulocytes 0.12 (*)    All other components within normal limits  BASIC METABOLIC PANEL - Abnormal; Notable for the following components:   Glucose,  Bld 114 (*)    Creatinine, Ser 1.36 (*)    All other components within normal limits  I-STAT CHEM 8, ED - Abnormal; Notable for the following components:   Creatinine, Ser 1.30 (*)    Glucose, Bld 114 (*)    All other components within normal limits  RESP PANEL BY RT-PCR (FLU A&B, COVID) ARPGX2  CULTURE, BLOOD (ROUTINE X 2)  CULTURE, BLOOD (ROUTINE X 2)  LACTIC ACID, PLASMA    EKG None  Radiology CT Soft Tissue Neck W Contrast  Result Date: 06/18/2020 CLINICAL DATA:  34 year old male with left neck swelling. Sore throat for 3 days now with significant dysphagia, difficulty opening mouth. Mild stridor. Possible abscess. EXAM: CT NECK WITH CONTRAST TECHNIQUE: Multidetector CT imaging of the neck was performed using the standard protocol following the bolus administration of intravenous contrast. CONTRAST:  37mL OMNIPAQUE IOHEXOL 300 MG/ML  SOLN COMPARISON:  Neck CT 10/23/2018. FINDINGS: Pharynx and larynx: The glottis is closed. The subglottic larynx remains normal. Supraglottic larynx and hypopharynx remarkable for asymmetric right side soft tissue swelling. Superimposed retropharyngeal effusion. Soft tissue swelling and asymmetry becomes more severe at the level of the palatine tonsils, and there is a mildly irregular shaped 22 x 28 x 28 mm abscess of the right palatine tonsil (series 3, image 18). Associated asymmetric right parapharyngeal space stranding. Mild asymmetric soft tissue swelling also at the adenoids on the right. Left parapharyngeal space is normal. Salivary glands: Negative sublingual space. Mild secondary inflammation in the right submandibular space. Left submandibular space and gland are normal. Bilateral parotid glands are within normal limits. Thyroid: Negative. Lymph nodes: Reactive right side cervical lymph nodes, levels 1 through 4. Right side level 2 lymph nodes nearly 2 cm short axis (series 3, image 37). Smaller hyperenhancing right retropharyngeal lymph nodes appear  reactive (series 3, image 18). No cystic or necrotic node. Vascular: Major vascular structures in the neck and at the skull base remain patent including the right IJ. Limited intracranial: Minimally included. Visualized orbits: Not included. Mastoids and visualized paranasal sinuses: Minimally included. Mild right maxillary sinus mucosal thickening. Skeleton: No carious dentition identified. No acute osseous abnormality identified. Upper chest: Negative; small volume residual thymus. IMPRESSION: Positive for a Right Tonsillar Abscess measuring up to 28 mm diameter. Associated widespread swelling of the right pharynx, supraglottic larynx. Small retropharyngeal effusion and secondary inflammation within the right parapharyngeal and submandibular spaces. Reactive right side lymphadenopathy. Electronically Signed   By: Odessa Fleming M.D.   On: 06/18/2020 19:09  Procedures Procedures (including critical care time)  CRITICAL CARE Performed by: Richardean Canal   Total critical care time: 30 minutes  Critical care time was exclusive of separately billable procedures and treating other patients.  Critical care was necessary to treat or prevent imminent or life-threatening deterioration.  Critical care was time spent personally by me on the following activities: development of treatment plan with patient and/or surrogate as well as nursing, discussions with consultants, evaluation of patient's response to treatment, examination of patient, obtaining history from patient or surrogate, ordering and performing treatments and interventions, ordering and review of laboratory studies, ordering and review of radiographic studies, pulse oximetry and re-evaluation of patient's condition.   Medications Ordered in ED Medications  HYDROmorphone (DILAUDID) injection 1 mg (has no administration in time range)  sodium chloride 0.9 % bolus 1,000 mL (0 mLs Intravenous Stopped 06/18/20 2007)  acetaminophen (TYLENOL) tablet 650 mg  (650 mg Oral Given 06/18/20 1901)  clindamycin (CLEOCIN) IVPB 600 mg (0 mg Intravenous Stopped 06/18/20 1924)  dexamethasone (DECADRON) injection 10 mg (10 mg Intravenous Given 06/18/20 1804)  iohexol (OMNIPAQUE) 300 MG/ML solution 75 mL (75 mLs Intravenous Contrast Given 06/18/20 1841)    ED Course  I have reviewed the triage vital signs and the nursing notes.  Pertinent labs & imaging results that were available during my care of the patient were reviewed by me and considered in my medical decision making (see chart for details).    MDM Rules/Calculators/A&P                         ANHAD SHEELEY is a 34 y.o. male here presenting with right neck swelling and trouble swallowing.  Patient has trismus and hot potato voice.  He has an obvious right peritonsillar abscess.  He also has swelling in the right neck so I'm concerned for retropharyngeal abscess as well.  Plan to get basic lab work and give clindamycin.  We'll also get CT neck.   8:16 PM CT showed peritonsillar and retropharyngeal abscess. Given clindamycin and decadron. Consulted Dr. Jearld Fenton to see patient.   10:00 PM WBC is 18. Dr. Jearld Fenton came and drained the PTA. Tolerated PO afterwards.Will dc home with clindamycin, pain meds. Will have him follow up with ENT in the office   Final Clinical Impression(s) / ED Diagnoses Final diagnoses:  None    Rx / DC Orders ED Discharge Orders    None       Charlynne Pander, MD 06/18/20 2201

## 2020-06-23 LAB — CULTURE, BLOOD (ROUTINE X 2)
Culture: NO GROWTH
Culture: NO GROWTH

## 2021-05-02 ENCOUNTER — Other Ambulatory Visit: Payer: Self-pay

## 2021-05-02 ENCOUNTER — Emergency Department (HOSPITAL_COMMUNITY): Payer: Medicare Other

## 2021-05-02 ENCOUNTER — Emergency Department (HOSPITAL_COMMUNITY)
Admission: EM | Admit: 2021-05-02 | Discharge: 2021-05-03 | Disposition: A | Discharge status: Home / Self Care | Payer: Medicare Other | Attending: Physician Assistant | Admitting: Physician Assistant

## 2021-05-02 DIAGNOSIS — R059 Cough, unspecified: Secondary | ICD-10-CM | POA: Insufficient documentation

## 2021-05-02 DIAGNOSIS — M25572 Pain in left ankle and joints of left foot: Secondary | ICD-10-CM | POA: Insufficient documentation

## 2021-05-02 DIAGNOSIS — Z5321 Procedure and treatment not carried out due to patient leaving prior to being seen by health care provider: Secondary | ICD-10-CM | POA: Diagnosis not present

## 2021-05-02 DIAGNOSIS — W07XXXA Fall from chair, initial encounter: Secondary | ICD-10-CM | POA: Diagnosis not present

## 2021-05-02 LAB — CBC WITH DIFFERENTIAL/PLATELET
Abs Immature Granulocytes: 0.35 10*3/uL — ABNORMAL HIGH (ref 0.00–0.07)
Basophils Absolute: 0.1 10*3/uL (ref 0.0–0.1)
Basophils Relative: 1 %
Eosinophils Absolute: 0.2 10*3/uL (ref 0.0–0.5)
Eosinophils Relative: 1 %
HCT: 45.2 % (ref 39.0–52.0)
Hemoglobin: 14.8 g/dL (ref 13.0–17.0)
Immature Granulocytes: 3 %
Lymphocytes Relative: 19 %
Lymphs Abs: 2.5 10*3/uL (ref 0.7–4.0)
MCH: 28.5 pg (ref 26.0–34.0)
MCHC: 32.7 g/dL (ref 30.0–36.0)
MCV: 87.1 fL (ref 80.0–100.0)
Monocytes Absolute: 1.1 10*3/uL — ABNORMAL HIGH (ref 0.1–1.0)
Monocytes Relative: 9 %
Neutro Abs: 8.7 10*3/uL — ABNORMAL HIGH (ref 1.7–7.7)
Neutrophils Relative %: 67 %
Platelets: 473 10*3/uL — ABNORMAL HIGH (ref 150–400)
RBC: 5.19 MIL/uL (ref 4.22–5.81)
RDW: 12.4 % (ref 11.5–15.5)
WBC: 12.8 10*3/uL — ABNORMAL HIGH (ref 4.0–10.5)
nRBC: 0 % (ref 0.0–0.2)

## 2021-05-02 LAB — TROPONIN I (HIGH SENSITIVITY): Troponin I (High Sensitivity): <2 ng/L (ref <18)

## 2021-05-02 LAB — COMPREHENSIVE METABOLIC PANEL
ALT: 54 U/L — ABNORMAL HIGH (ref 0–44)
AST: 23 U/L (ref 15–41)
Albumin: 3.9 g/dL (ref 3.5–5.0)
Alkaline Phosphatase: 63 U/L (ref 38–126)
Anion gap: 10 (ref 5–15)
BUN: 15 mg/dL (ref 6–20)
CO2: 25 mmol/L (ref 22–32)
Calcium: 9.5 mg/dL (ref 8.9–10.3)
Chloride: 102 mmol/L (ref 98–111)
Creatinine, Ser: 1.13 mg/dL (ref 0.61–1.24)
GFR, Estimated: >60 mL/min (ref >60)
Glucose, Bld: 112 mg/dL — ABNORMAL HIGH (ref 70–99)
Potassium: 4.4 mmol/L (ref 3.5–5.1)
Sodium: 137 mmol/L (ref 135–145)
Total Bilirubin: 0.5 mg/dL (ref 0.3–1.2)
Total Protein: 7.2 g/dL (ref 6.5–8.1)

## 2021-05-02 NOTE — ED Provider Notes (Signed)
Emergency Medicine Provider Triage Evaluation Note  Kevin Gentry , a 35 y.o. male  was evaluated in triage.  Pt complains of passing out and pain in the left ankle.   He reports that he was smoking a cigarette and got down to the filter accidentally and inhaled.  He coughed a lot and passed out for a second.  He reports pain in his left ankle since.  He denies any chest pain or fevers.  No headache.  He denies stimulant use.  He has had post tussive syncope before but no history of evaluation for it.   Review of Systems  Positive: Syncope, left ankle pain Negative: Chest pain  Physical Exam  BP (!) 148/102 (BP Location: Left Arm)   Pulse (!) 120   Temp 98.1 F (36.7 C) (Oral)   Resp 16   Ht 6\' 3"  (1.905 m)   Wt 127 kg   SpO2 97%   BMI 35.00 kg/m  Gen:   Awake, no distress   Resp:  Normal effort  MSK:   Pain with movements of left ankle.  Other:  Tachycardic.    Medical Decision Making  Medically screening exam initiated at 9:53 PM.  Appropriate orders placed.  TOBI LEINWEBER was informed that the remainder of the evaluation will be completed by another provider, this initial triage assessment does not replace that evaluation, and the importance of remaining in the ED until their evaluation is complete.  Patient presents today for evaluation of posttussive syncope.  He has had this before never been evaluated.  Will obtain EKG, chest x-ray, labs.  He is still tachycardic, he says that that is not abnormal for him and denies any stimulant use. We will also obtain ankle x-rays.   Ignatius Specking 05/02/21 2204    2205, MD 05/02/21 2303

## 2021-05-02 NOTE — ED Triage Notes (Addendum)
Pt via POC c/o left ankle pain after sustaining injury on Thursday night while falling out of the chair. Pt states he was smoking and began to coughing uncontrollably when he blacked out, states this has happened before. Pt awoke on the floor but has no other complaints besides left ankle pain. Denies dizziness/cp. Triage HR 120

## 2021-06-11 ENCOUNTER — Other Ambulatory Visit: Payer: Self-pay

## 2021-06-11 ENCOUNTER — Emergency Department (HOSPITAL_COMMUNITY)
Admission: EM | Admit: 2021-06-11 | Discharge: 2021-06-11 | Disposition: A | Discharge status: Home / Self Care | Payer: Medicare Other | Attending: Emergency Medicine | Admitting: Emergency Medicine

## 2021-06-11 ENCOUNTER — Encounter (HOSPITAL_COMMUNITY): Payer: Self-pay

## 2021-06-11 DIAGNOSIS — F17210 Nicotine dependence, cigarettes, uncomplicated: Secondary | ICD-10-CM | POA: Diagnosis not present

## 2021-06-11 DIAGNOSIS — R7989 Other specified abnormal findings of blood chemistry: Secondary | ICD-10-CM

## 2021-06-11 DIAGNOSIS — Z79899 Other long term (current) drug therapy: Secondary | ICD-10-CM | POA: Insufficient documentation

## 2021-06-11 DIAGNOSIS — T50901A Poisoning by unspecified drugs, medicaments and biological substances, accidental (unintentional), initial encounter: Secondary | ICD-10-CM

## 2021-06-11 DIAGNOSIS — Y900 Blood alcohol level of less than 20 mg/100 ml: Secondary | ICD-10-CM | POA: Insufficient documentation

## 2021-06-11 DIAGNOSIS — T405X1A Poisoning by cocaine, accidental (unintentional), initial encounter: Secondary | ICD-10-CM | POA: Insufficient documentation

## 2021-06-11 LAB — CBC WITH DIFFERENTIAL/PLATELET
Abs Immature Granulocytes: 0.15 10*3/uL — ABNORMAL HIGH (ref 0.00–0.07)
Basophils Absolute: 0.1 10*3/uL (ref 0.0–0.1)
Basophils Relative: 0 %
Eosinophils Absolute: 0.1 10*3/uL (ref 0.0–0.5)
Eosinophils Relative: 1 %
HCT: 42.5 % (ref 39.0–52.0)
Hemoglobin: 14 g/dL (ref 13.0–17.0)
Immature Granulocytes: 1 %
Lymphocytes Relative: 10 %
Lymphs Abs: 1.6 10*3/uL (ref 0.7–4.0)
MCH: 28.7 pg (ref 26.0–34.0)
MCHC: 32.9 g/dL (ref 30.0–36.0)
MCV: 87.3 fL (ref 80.0–100.0)
Monocytes Absolute: 0.8 10*3/uL (ref 0.1–1.0)
Monocytes Relative: 5 %
Neutro Abs: 13.4 10*3/uL — ABNORMAL HIGH (ref 1.7–7.7)
Neutrophils Relative %: 83 %
Platelets: 331 10*3/uL (ref 150–400)
RBC: 4.87 MIL/uL (ref 4.22–5.81)
RDW: 13 % (ref 11.5–15.5)
WBC: 16.2 10*3/uL — ABNORMAL HIGH (ref 4.0–10.5)
nRBC: 0 % (ref 0.0–0.2)

## 2021-06-11 LAB — COMPREHENSIVE METABOLIC PANEL
ALT: 90 U/L — ABNORMAL HIGH (ref 0–44)
AST: 109 U/L — ABNORMAL HIGH (ref 15–41)
Albumin: 4.7 g/dL (ref 3.5–5.0)
Alkaline Phosphatase: 68 U/L (ref 38–126)
Anion gap: 10 (ref 5–15)
BUN: 20 mg/dL (ref 6–20)
CO2: 26 mmol/L (ref 22–32)
Calcium: 8.9 mg/dL (ref 8.9–10.3)
Chloride: 101 mmol/L (ref 98–111)
Creatinine, Ser: 1.29 mg/dL — ABNORMAL HIGH (ref 0.61–1.24)
GFR, Estimated: >60 mL/min (ref >60)
Glucose, Bld: 177 mg/dL — ABNORMAL HIGH (ref 70–99)
Potassium: 3.4 mmol/L — ABNORMAL LOW (ref 3.5–5.1)
Sodium: 137 mmol/L (ref 135–145)
Total Bilirubin: 0.5 mg/dL (ref 0.3–1.2)
Total Protein: 7.9 g/dL (ref 6.5–8.1)

## 2021-06-11 LAB — RAPID URINE DRUG SCREEN, HOSP PERFORMED
Amphetamines: NOT DETECTED
Barbiturates: NOT DETECTED
Benzodiazepines: NOT DETECTED
Cocaine: POSITIVE — AB
Opiates: NOT DETECTED
Tetrahydrocannabinol: POSITIVE — AB

## 2021-06-11 LAB — SALICYLATE LEVEL: Salicylate Lvl: <7 mg/dL — ABNORMAL LOW (ref 7.0–30.0)

## 2021-06-11 LAB — ETHANOL: Alcohol, Ethyl (B): <10 mg/dL (ref <10)

## 2021-06-11 LAB — ACETAMINOPHEN LEVEL: Acetaminophen (Tylenol), Serum: <10 ug/mL — ABNORMAL LOW (ref 10–30)

## 2021-06-11 LAB — CBG MONITORING, ED: Glucose-Capillary: 148 mg/dL — ABNORMAL HIGH (ref 70–99)

## 2021-06-11 MED ORDER — SODIUM CHLORIDE 0.9 % IV BOLUS
1000.0000 mL | Freq: Once | INTRAVENOUS | Status: AC
  Administered 2021-06-11: 1000 mL via INTRAVENOUS

## 2021-06-11 NOTE — ED Triage Notes (Signed)
Pt BIB EMS. Pt was found apneic on the floor and given 2mg  of Narcan. Pt is now A&O x4, but reports feeling like the effects are coming back. Pt is extremely diaphoretic at this. Pt reports picking something up off the street and snorting it.   18G LAC

## 2021-06-11 NOTE — ED Provider Notes (Signed)
[facility]-EMERGENCY DEPT Provider Note   CSN: [id number] Arrival date & time: 06/11/21  6734     History Chief Complaint  Patient presents with   Drug Overdose    Kevin Gentry is a 35 y.o. male here with drug overdose. Patient states that he was with a friend and he was trying out something and decided to snort it.  He then became altered and felt dizzy.  He states that he smokes marijuana at baseline.  Patient received Narcan by EMS.  Patient denies trying to kill himself.  Denies thoughts of harming himself or others or hallucinations  The history is provided by the patient.      History reviewed. No pertinent past medical history.  Patient Active Problem List   Diagnosis Date Noted   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    Past Surgical History:  Procedure Laterality Date   WRIST SURGERY         Family History  Problem Relation Age of Onset   Healthy Mother    Healthy Father     Social History   Tobacco Use   Smoking status: Every Day    Types: Cigars   Smokeless tobacco: Never  Vaping Use   Vaping Use: Never used  Substance Use Topics   Alcohol use: Yes   Drug use: No    Home Medications Prior to Admission medications   Medication Sig Start Date End Date Taking? Authorizing Provider  clindamycin (CLEOCIN) 300 MG capsule Take 1 capsule (300 mg total) by mouth 4 (four) times daily. X 7 days 06/18/20   Charlynne Pander, MD  cyclobenzaprine (FLEXERIL) 5 MG tablet Take 1 tablet (5 mg total) by mouth at bedtime. Patient not taking: Reported on 06/18/2020 03/11/18   Elvina Sidle, MD  diclofenac (VOLTAREN) 75 MG EC tablet Take 1 tablet (75 mg total) by mouth 2 (two) times daily. Patient not taking: Reported on 06/18/2020 03/11/18   Elvina Sidle, MD  HYDROcodone-acetaminophen (NORCO/VICODIN) 5-325 MG tablet Take 1 tablet by mouth every 6 (six) hours as needed. 06/18/20   Charlynne Pander, MD     Allergies    Patient has no known allergies.  Review of Systems   Review of Systems  Psychiatric/Behavioral:  Positive for confusion.   All other systems reviewed and are negative.  Physical Exam Updated Vital Signs BP (!) 130/100   Pulse 77   Temp 97.9 F (36.6 C) (Oral)   Resp 12   SpO2 94%   Physical Exam Vitals and nursing note reviewed.  Constitutional:      Appearance: Normal appearance.  HENT:     Head: Normocephalic.     Nose: Nose normal.     Mouth/Throat:     Mouth: Mucous membranes are moist.  Eyes:     Extraocular Movements: Extraocular movements intact.     Pupils: Pupils are equal, round, and reactive to light.  Cardiovascular:     Rate and Rhythm: Normal rate and regular rhythm.     Pulses: Normal pulses.     Heart sounds: Normal heart sounds.  Pulmonary:     Effort: Pulmonary effort is normal.     Breath sounds: Normal breath sounds.  Abdominal:     General: Abdomen is flat.     Palpations: Abdomen is soft.  Musculoskeletal:        General: Normal range of motion.     Cervical back: Normal range of motion and neck supple.  Skin:    General: Skin is warm.     Capillary Refill: Capillary refill takes less than 2 seconds.  Neurological:     General: No focal deficit present.     Mental Status: He is alert and oriented to person, place, and time.  Psychiatric:        Mood and Affect: Mood normal.        Behavior: Behavior normal.    ED Results / Procedures / Treatments   Labs (all labs ordered are listed, but only abnormal results are displayed) Labs Reviewed  COMPREHENSIVE METABOLIC PANEL - Abnormal; Notable for the following components:      Result Value   Potassium 3.4 (*)    Glucose, Bld 177 (*)    Creatinine, Ser 1.29 (*)    AST 109 (*)    ALT 90 (*)    All other components within normal limits  SALICYLATE LEVEL - Abnormal; Notable for the following components:   Salicylate Lvl <7.0 (*)    All other components within normal  limits  ACETAMINOPHEN LEVEL - Abnormal; Notable for the following components:   Acetaminophen (Tylenol), Serum <10 (*)    All other components within normal limits  RAPID URINE DRUG SCREEN, HOSP PERFORMED - Abnormal; Notable for the following components:   Cocaine POSITIVE (*)    Tetrahydrocannabinol POSITIVE (*)    All other components within normal limits  CBC WITH DIFFERENTIAL/PLATELET - Abnormal; Notable for the following components:   WBC 16.2 (*)    Neutro Abs 13.4 (*)    Abs Immature Granulocytes 0.15 (*)    All other components within normal limits  CBG MONITORING, ED - Abnormal; Notable for the following components:   Glucose-Capillary 148 (*)    All other components within normal limits  ETHANOL    EKG EKG Interpretation  Date/Time:  Wednesday June 11 2021 19:03:10 EST Ventricular Rate:  95 PR Interval:  151 QRS Duration: 99 QT Interval:  330 QTC Calculation: 415 R Axis:   19 Text Interpretation: Sinus rhythm Probable left atrial enlargement Low voltage, precordial leads RSR' in V1 or V2, right VCD or RVH Nonspecific T abnormalities, lateral leads No significant change since last tracing Confirmed by Richardean Canal [phone]) on 06/11/2021 7:18:53 PM  Radiology No results found.  Procedures Procedures   Medications Ordered in ED Medications  sodium chloride 0.9 % bolus 1,000 mL (1,000 mLs Intravenous New Bag/Given 06/11/21 1958)    ED Course  I have reviewed the triage vital signs and the nursing notes.  Pertinent labs & imaging results that were available during my care of the patient were reviewed by me and considered in my medical decision making (see chart for details).    MDM Rules/Calculators/A&P                           Kevin Gentry is a 35 y.o. male here presenting with drug overdose.  Patient was smoking weed and wanted to try something new.  Patient adamantly denies suicidal homicidal ideations.  Went to check labs and tox and UDS  9:41  PM Patient observed for about 3 hours now.  He is awake and alert.  UDS is positive for cocaine and marijuana.  Patient's LFTs slightly elevated but abdomen is nontender.  Stable for discharge.   Final Clinical Impression(s) / ED Diagnoses Final diagnoses:  None    Rx / DC Orders ED Discharge Orders  None        Charlynne Pander, MD 06/11/21 2141

## 2021-06-11 NOTE — Discharge Instructions (Addendum)
Please avoid using drugs  Your liver function test is slightly elevated.  Please repeat with your doctor in a week.  See your doctor for follow-up  Return to ER if you have thoughts of harming yourself or others, overdose

## 2021-06-11 NOTE — ED Provider Notes (Signed)
Emergency Medicine Provider Triage Evaluation Note  Kevin Gentry , a 35 y.o. male  was evaluated in triage.  Pt brought in by EMS for an overdose.  Reportedly snorted some drugs that he found outside.  Smokes weed and uses cocaine occasionally.  Remembers getting lightheaded and lowering himself to the Gentry.  Received Narcan by EMS  Review of Systems  Positive: Feels as though he is going "in and out of it." Negative: Chest pain, shortness of breath  Physical Exam  BP (!) 145/96 (BP Location: Right Arm)   Pulse 92   Temp 97.9 F (36.6 C) (Oral)   Resp 20   SpO2 97%  Gen:   Awake, no distress   Resp:  Normal effort  MSK:   Moves extremities without difficulty  Other:  Extremely diaphoretic in triage.  Medical Decision Making  Medically screening exam initiated at 6:55 PM.  Appropriate orders placed.  Kevin Gentry was informed that the remainder of the evaluation will be completed by another provider, this initial triage assessment does not replace that evaluation, and the importance of remaining in the ED until their evaluation is complete.     Woodroe Chen 06/11/21 1904    Charlynne Pander, MD 06/11/21 [phone]

## 2022-06-18 ENCOUNTER — Emergency Department (HOSPITAL_COMMUNITY): Payer: Medicare Other

## 2022-06-18 ENCOUNTER — Emergency Department (HOSPITAL_COMMUNITY)
Admission: EM | Admit: 2022-06-18 | Discharge: 2022-06-19 | Disposition: A | Discharge status: Home / Self Care | Payer: Medicare Other | Attending: Student | Admitting: Student

## 2022-06-18 ENCOUNTER — Other Ambulatory Visit: Payer: Self-pay

## 2022-06-18 DIAGNOSIS — T50901A Poisoning by unspecified drugs, medicaments and biological substances, accidental (unintentional), initial encounter: Secondary | ICD-10-CM | POA: Insufficient documentation

## 2022-06-18 DIAGNOSIS — F17290 Nicotine dependence, other tobacco product, uncomplicated: Secondary | ICD-10-CM | POA: Insufficient documentation

## 2022-06-18 MED ORDER — ONDANSETRON HCL 4 MG/2ML IJ SOLN
4.0000 mg | Freq: Once | INTRAMUSCULAR | Status: AC
  Administered 2022-06-18: 4 mg via INTRAVENOUS
  Filled 2022-06-18: qty 2

## 2022-06-18 MED ORDER — NALOXONE HCL 0.4 MG/ML IJ SOLN
0.4000 mg | Freq: Once | INTRAMUSCULAR | Status: DC
  Filled 2022-06-18: qty 1

## 2022-06-18 NOTE — ED Triage Notes (Signed)
BIBA from an apartment medics found him unresponsive with pinpoint pupils, gave 0.5 Narcan IV and 4mg  Zofran  140/100 CBG 341 92% RA 97% 2LNC 18 LAC

## 2022-06-18 NOTE — ED Provider Notes (Signed)
[facility]-EMERGENCY DEPT Provider Note  CSN: [id number] Arrival date & time: 06/18/22 2010  Chief Complaint(s) Drug Overdose  HPI Kevin Gentry is a 36 y.o. male who presents emergency department for evaluation of a suspected drug overdose.  Patient states that he snorted an unknown powder and was found unresponsive by EMS with pinpoint pupils.  Received 0.4 mg IV Narcan by EMS and 4 mg Zofran with improvement of mental status.  On arrival, patient saturating between 88 and 92% on room air.  He is able to answer questions but is intermittently somnolent and confused.  Additional history unable to be obtained.   Past Medical History No past medical history on file. Patient Active Problem List   Diagnosis Date Noted   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   Home Medication(s) Prior to Admission medications   Medication Sig Start Date End Date Taking? Authorizing Provider  clindamycin (CLEOCIN) 300 MG capsule Take 1 capsule (300 mg total) by mouth 4 (four) times daily. X 7 days 06/18/20   Charlynne Pander, MD  cyclobenzaprine (FLEXERIL) 5 MG tablet Take 1 tablet (5 mg total) by mouth at bedtime. Patient not taking: Reported on 06/18/2020 03/11/18   Elvina Sidle, MD  diclofenac (VOLTAREN) 75 MG EC tablet Take 1 tablet (75 mg total) by mouth 2 (two) times daily. Patient not taking: Reported on 06/18/2020 03/11/18   Elvina Sidle, MD  HYDROcodone-acetaminophen (NORCO/VICODIN) 5-325 MG tablet Take 1 tablet by mouth every 6 (six) hours as needed. 06/18/20   Charlynne Pander, MD                                                                                                                                    Past Surgical History Past Surgical History:  Procedure Laterality Date   WRIST SURGERY     Family History Family History  Problem Relation Age of Onset   Healthy Mother    Healthy Father     Social History Social  History   Tobacco Use   Smoking status: Every Day    Types: Cigars   Smokeless tobacco: Never  Vaping Use   Vaping Use: Never used  Substance Use Topics   Alcohol use: Yes   Drug use: No   Allergies Patient has no known allergies.  Review of Systems Review of Systems  Unable to perform ROS: Mental status change    Physical Exam Vital Signs  I have reviewed the triage vital signs BP (!) 178/109 (BP Location: Left Arm)   Pulse (!) 105   Temp 97.8 F (36.6 C) (Oral)   Resp (!) 22   Ht 6\' 3"  (1.905 m)   Wt 127 kg   SpO2 96%   BMI 35.00 kg/m   Physical Exam Constitutional:      General: He is not in acute distress.    Appearance: Normal appearance.  HENT:  Head: Normocephalic and atraumatic.     Nose: No congestion or rhinorrhea.  Eyes:     General:        Right eye: No discharge.        Left eye: No discharge.     Extraocular Movements: Extraocular movements intact.     Pupils: Pupils are equal, round, and reactive to light.  Cardiovascular:     Rate and Rhythm: Normal rate and regular rhythm.     Heart sounds: No murmur heard. Pulmonary:     Effort: No respiratory distress.     Breath sounds: No wheezing or rales.  Abdominal:     General: There is no distension.     Tenderness: There is no abdominal tenderness.  Musculoskeletal:        General: Normal range of motion.     Cervical back: Normal range of motion.  Skin:    General: Skin is warm and dry.  Neurological:     General: No focal deficit present.     Mental Status: He is alert and oriented to person, place, and time.     ED Results and Treatments Labs (all labs ordered are listed, but only abnormal results are displayed) Labs Reviewed - No data to display                                                                                                                        Radiology No results found.  Pertinent labs & imaging results that were available during my care of the patient  were reviewed by me and considered in my medical decision making (see MDM for details).  Medications Ordered in ED Medications - No data to display                                                                                                                                   Procedures .Critical Care  Performed by: Glendora Score, MD Authorized by: Glendora Score, MD   Critical care provider statement:    Critical care time (minutes):  30   Critical care was necessary to treat or prevent imminent or life-threatening deterioration of the following conditions:  Respiratory failure   Critical care was time spent personally by me on the following activities:  Development of treatment plan with patient or surrogate, discussions with consultants, evaluation of patient's response to treatment, examination of patient, ordering and review of  laboratory studies, ordering and review of radiographic studies, ordering and performing treatments and interventions, pulse oximetry, re-evaluation of patient's condition and review of old charts   (including critical care time)  Medical Decision Making / ED Course   This patient presents to the ED for concern of accidental drug overdose, this involves an extensive number of treatment options, and is a complaint that carries with it a high risk of complications and morbidity.  The differential diagnosis includes opioid overdose, aspiration pneumonia, polysubstance use, withdrawal  MDM: Patient seen emergency room for evaluation of neck Centyl overdose.  Physical exam with a somnolent but arousable patient.  Cardiopulmonary exam unremarkable.  Chest x-ray unremarkable.  Patient required 2 L nasal cannula to maintain oxygen saturations greater than 93% and at time of signout, patient will require metabolization of underlying suspected opioid overdose and likely will be safe for discharge.  Please see provider signout for continuation of workup.   Additional  history obtained:  -External records from outside source obtained and reviewed including: Chart review including previous notes, labs, imaging, consultation notes   Lab Tests: -I ordered, reviewed, and interpreted labs.   The pertinent results include:   Labs Reviewed - No data to display      Imaging Studies ordered: I ordered imaging studies including CXR I independently visualized and interpreted imaging. I agree with the radiologist interpretation   Medicines ordered and prescription drug management: No orders of the defined types were placed in this encounter.   -I have reviewed the patients home medicines and have made adjustments as needed  Critical interventions Oxygen supplementation   Cardiac Monitoring: The patient was maintained on a cardiac monitor.  I personally viewed and interpreted the cardiac monitored which showed an underlying rhythm of: NSR  Social Determinants of Health:  Factors impacting patients care include: Polysubstance use   Reevaluation: After the interventions noted above, I reevaluated the patient and found that they have :improved  Co morbidities that complicate the patient evaluation No past medical history on file.    Dispostion: I considered admission for this patient, and disposition pending metabolization of underlying substance and improvement of oxygen saturation.  Please see provider signout for continuation of workup.     Final Clinical Impression(s) / ED Diagnoses Final diagnoses:  None     @PCDICTATION @    Teressa Lower, MD 06/19/22 1152

## 2022-06-19 MED ORDER — ACETAMINOPHEN 325 MG PO TABS
650.0000 mg | ORAL_TABLET | Freq: Once | ORAL | Status: AC
  Administered 2022-06-19: 650 mg via ORAL
  Filled 2022-06-19: qty 2

## 2022-06-19 NOTE — Discharge Instructions (Signed)
Substance Abuse Treatment Programs ° °Intensive Outpatient Programs °[facility]     °[address]      °[place], [place]                   °[phone]      ° °[facility] °[address] °[place], [place] °[phone] ° °[facility]     °(Inpatient and outpatient)     °[address]           °[phone]   ° °[facility] °[phone] (Suboxone and Methadone) ° °[address]      °[place], [place] [address]      °[phone]      ° °[address] °[place], [place] °852-3033 ° °[facility] (Outpatient/Inpatient, [facility])    °(insurance only) [phone]      °       °[facility] (Groups & Residential) °[place], [place] °[phone] ° °   °[facility]     °[address]     °[place], [place]      °[phone]      ° °[facility] (for caregivers and family) °[address] °[place], [place] °336-299-4655 ° ° ° ° ° °Residential Treatment Programs °[facility]      °[address], [place], [place] [address]  °[phone]      ° °[facility] °[address] [place], [place] [address] °[phone] ° °[facility]        °336-248-8914      ° °[facility] °1-800-659-3381 ° °[facility]             °[address]                                         °[place], [place]                                                °877-615-2722 or 336-784-9470                              ° °[facility] [place] °[address] °[place], [address] °1.877.941.8954 ° °[facility]    °[address]      °[place], [place]     °[phone]      ° °[facility] °[address] °[place], [place] °336-285-9073 ° °[facility]   °[address]     °[place], [place] [address]     °[phone]      °Admissions: 8am-3pm M-F ° °[facility] °[address] °[place] °336-227-7417 ° °[facility]: Residential Program (90 Days)   °[place], [place]      °336-725-8389 or [phone]    ° °[facility] °[place], [place] °(Walk in Hours over the weekend or by referral) ° °[facility] °[address], [place], [place] [address] °([phone] ° °Crisis Mobile: [facility]:  1-877-626-1772 (for crisis response 24 hours a day) °[facility] Hotline:      1-800-256-2452 °Outpatient Psychiatry and Counseling ° °[facility]: Mobile Crisis   Management 24 hours:  1-877-626-1772 ° °[facility] sliding scale fee and walk in schedule: M-F 8am-12pm/1pm-3pm °[address]  °[place], [place] [address] °[phone] ° °[facility] °[address] °[place], [place] [address] °336-703-9650 ° °[facility] ([facility]ly [facility])- new patient walk-in appointments available Monday - Friday 8am -3pm.          °[address] °[place], [place] [address] °336-676-6840 or crisis line- 336-676-6905 ° °[facility] Intensive Outpatient Therapy Program °[address] °[place], [place] [address] °336-832-9804 ° °[facility]                  °Crisis Services      °[phone]      °[address]     °[place], [place] [address]                ° °[facility]   °[facility] °[phone] °[address] °[place], [place] [address] ° ° °[facility]          °[address]  °[place], [place] [address]       °[phone] ° °[facility] °[address] °[place], [place] [address] °336-292-1510 ° °[facility]    °[address]    °[place], [place] [address]     °[phone]      ° °Parish McKinney, [place]     °[address]     °[place] [address]     °336-282-1251     °  °[facility] °[address] °[place] [address] ° °[facility]     °[address]     °[place], [place]      °[phone]      ° °[facility] °Shamsher Ahluwalia, MD °[address] °[place], [place] [address] °[phone] ° °[facility]     °[address]     °[place], [place] [address]     °336-274-1237      ° °[facility] °[address] °[place], [place] [address] °336-854-4450 °Resources for Temporary Residential Assistance/Crisis Centers ° °DAY CENTERS °[facility] °M-F 8am-3pm   °[address] [place], [place] [address]   336-332-0824 °Services include: laundry, barbering, support groups, case management, phone  & computer access, showers, AA/NA mtgs, mental health/substance abuse nurse, job skills class, disability information, VA assistance, spiritual classes, etc.  ° °HOMELESS SHELTERS ° °[facility]     °[facility]   °[address], [place]     °[phone]       °       °[facility] (women and children)       °[address] °, [place] [address] °336-275-0820 °[email or web address] for application and process °Application Required ° °[facility]   °[address]    °[place] [address]     °[phone]       °             °[facility] °[address] °, [place] [address] °336.273.5572 °336-235-0363(schedule application appt.) °Application Required ° °[facility] (women only)    °[address]     °[place],  [address]     °336-884-1039      °  Intake starts 6pm daily °Need valid ID, SSC, & Police report °[facility] [place] °[address] °[place] °336-881-5420 °Application Required ° °[facility] (men only)     °[address]      °[place], [place]     °[phone]      ° °[facility] °(Pregnant women only) °[address] °[place], [place] °336-275-0206 ° °[facility]      °[address]      °[place], [place] [address]     °[phone]      °       °[facility] °[address] °[place], [place] °336-723-1848 °90 day commitment/SA/Application process ° °[facility](men only)     °[address]     °[place], [place]     °[phone]       °Check-in at 7pm     °       °[facility] °[address] °[place], [place] [address] °336-248-6684 °Men/Women/Women and Children must be there by 7 pm ° °[facility] °[place], [place] °336-722-8721                ° °

## 2022-06-19 NOTE — ED Provider Notes (Signed)
Plan at signout was to monitor patient after accidental overdose.  Patient is now awake and alert, resting comfortably.  He is talking on the phone in no acute distress.  He is safe for discharge home.  He has been monitored for several hours without any deterioration   Zadie Rhine, MD 06/19/22 0205

## 2022-11-06 ENCOUNTER — Encounter (HOSPITAL_COMMUNITY): Payer: Self-pay

## 2022-11-06 ENCOUNTER — Emergency Department (HOSPITAL_COMMUNITY)
Admission: EM | Admit: 2022-11-06 | Discharge: 2022-11-06 | Disposition: A | Discharge status: Home / Self Care | Payer: Medicare Other | Attending: Emergency Medicine | Admitting: Emergency Medicine

## 2022-11-06 ENCOUNTER — Emergency Department (HOSPITAL_COMMUNITY): Payer: Medicare Other

## 2022-11-06 DIAGNOSIS — J039 Acute tonsillitis, unspecified: Secondary | ICD-10-CM

## 2022-11-06 DIAGNOSIS — Z1152 Encounter for screening for COVID-19: Secondary | ICD-10-CM | POA: Diagnosis not present

## 2022-11-06 DIAGNOSIS — J020 Streptococcal pharyngitis: Secondary | ICD-10-CM | POA: Insufficient documentation

## 2022-11-06 DIAGNOSIS — E876 Hypokalemia: Secondary | ICD-10-CM | POA: Diagnosis not present

## 2022-11-06 DIAGNOSIS — J029 Acute pharyngitis, unspecified: Secondary | ICD-10-CM | POA: Diagnosis present

## 2022-11-06 LAB — CBC WITH DIFFERENTIAL/PLATELET
Abs Immature Granulocytes: 0.11 10*3/uL — ABNORMAL HIGH (ref 0.00–0.07)
Basophils Absolute: 0.1 10*3/uL (ref 0.0–0.1)
Basophils Relative: 0 %
Eosinophils Absolute: 0 10*3/uL (ref 0.0–0.5)
Eosinophils Relative: 0 %
HCT: 43.3 % (ref 39.0–52.0)
Hemoglobin: 14.8 g/dL (ref 13.0–17.0)
Immature Granulocytes: 1 %
Lymphocytes Relative: 6 %
Lymphs Abs: 1.3 10*3/uL (ref 0.7–4.0)
MCH: 28.7 pg (ref 26.0–34.0)
MCHC: 34.2 g/dL (ref 30.0–36.0)
MCV: 84.1 fL (ref 80.0–100.0)
Monocytes Absolute: 2.3 10*3/uL — ABNORMAL HIGH (ref 0.1–1.0)
Monocytes Relative: 11 %
Neutro Abs: 16.7 10*3/uL — ABNORMAL HIGH (ref 1.7–7.7)
Neutrophils Relative %: 82 %
Platelets: 280 10*3/uL (ref 150–400)
RBC: 5.15 MIL/uL (ref 4.22–5.81)
RDW: 12.9 % (ref 11.5–15.5)
WBC: 20.4 10*3/uL — ABNORMAL HIGH (ref 4.0–10.5)
nRBC: 0 % (ref 0.0–0.2)

## 2022-11-06 LAB — BASIC METABOLIC PANEL
Anion gap: 14 (ref 5–15)
BUN: 9 mg/dL (ref 6–20)
CO2: 23 mmol/L (ref 22–32)
Calcium: 8.7 mg/dL — ABNORMAL LOW (ref 8.9–10.3)
Chloride: 95 mmol/L — ABNORMAL LOW (ref 98–111)
Creatinine, Ser: 1.09 mg/dL (ref 0.61–1.24)
GFR, Estimated: >60 mL/min (ref >60)
Glucose, Bld: 114 mg/dL — ABNORMAL HIGH (ref 70–99)
Potassium: 3.2 mmol/L — ABNORMAL LOW (ref 3.5–5.1)
Sodium: 132 mmol/L — ABNORMAL LOW (ref 135–145)

## 2022-11-06 LAB — RESP PANEL BY RT-PCR (RSV, FLU A&B, COVID)  RVPGX2
Influenza A by PCR: NEGATIVE
Influenza B by PCR: NEGATIVE
Resp Syncytial Virus by PCR: NEGATIVE
SARS Coronavirus 2 by RT PCR: NEGATIVE

## 2022-11-06 LAB — GROUP A STREP BY PCR: Group A Strep by PCR: DETECTED — AB

## 2022-11-06 MED ORDER — ACETAMINOPHEN 500 MG PO TABS
1000.0000 mg | ORAL_TABLET | Freq: Once | ORAL | Status: AC
  Administered 2022-11-06: 1000 mg via ORAL
  Filled 2022-11-06: qty 2

## 2022-11-06 MED ORDER — DEXAMETHASONE SODIUM PHOSPHATE 10 MG/ML IJ SOLN
10.0000 mg | Freq: Once | INTRAMUSCULAR | Status: AC
  Administered 2022-11-06: 10 mg via INTRAVENOUS
  Filled 2022-11-06: qty 1

## 2022-11-06 MED ORDER — SODIUM CHLORIDE 0.9 % IV SOLN
3.0000 g | Freq: Once | INTRAVENOUS | Status: AC
  Administered 2022-11-06: 3 g via INTRAVENOUS
  Filled 2022-11-06: qty 8

## 2022-11-06 MED ORDER — SODIUM CHLORIDE 0.9 % IV BOLUS
1000.0000 mL | Freq: Once | INTRAVENOUS | Status: AC
  Administered 2022-11-06: 1000 mL via INTRAVENOUS

## 2022-11-06 MED ORDER — CLINDAMYCIN HCL 150 MG PO CAPS: 150.0000 mg | ORAL_CAPSULE | Freq: Four times a day (QID) | ORAL | 0 refills | Status: DC

## 2022-11-06 MED ORDER — IOHEXOL 300 MG/ML  SOLN
100.0000 mL | Freq: Once | INTRAMUSCULAR | Status: AC | PRN
  Administered 2022-11-06: 100 mL via INTRAVENOUS

## 2022-11-06 NOTE — Discharge Instructions (Addendum)
Evaluation today revealed that you have acute tonsillitis with the beginnings of a small abscess in your right tonsil.  At this time I believe you are safe to go home with antibiotics.  I am sending clindamycin to your pharmacy to treat your tonsillitis.  Also recommend that you follow-up with ENT.  I provided the contact information or discharge summary.  If you have trouble breathing, cannot swallow your secretions, uncontrollable fever, extreme fatigue or altered mental status or any other concerning symptom please return emerged part for evaluation.

## 2022-11-06 NOTE — ED Notes (Signed)
Pt given water for fluid challenge 

## 2022-11-06 NOTE — ED Provider Notes (Signed)
[place] EMERGENCY DEPARTMENT AT [facility] Provider Note   CSN: [id number] Arrival date & time: 11/06/22  1922     History  Chief Complaint  Patient presents with   Sore Throat    Kevin Gentry is a 37 y.o. male.   Sore Throat     Patient is a 66 old male presenting to the emergency department due to 3 days of sore throat and fever.  States he has been having also headache and diarrhea.  Difficulty talking and opening his mouth wide.  No medicine prior to arrival.  Home Medications Prior to Admission medications   Not on File      Allergies    Patient has no known allergies.    Review of Systems   Review of Systems  Physical Exam Updated Vital Signs BP 118/83 (BP Location: Right Arm)   Pulse (!) 114   Temp (!) 102.9 F (39.4 C) (Oral)   Resp 18   Ht 6\' 3"  (1.905 m)   Wt 113.4 kg   SpO2 94%   BMI 31.25 kg/m  Physical Exam Vitals and nursing note reviewed. Exam conducted with a chaperone present.  Constitutional:      Appearance: Normal appearance.  HENT:     Head: Normocephalic and atraumatic.     Mouth/Throat:     Pharynx: Posterior oropharyngeal erythema present.     Tonsils: Tonsillar exudate present.     Comments: Uvular deviation, right peritonsillar abscess.  Bilateral tonsillar exudate, trismus Eyes:     General: No scleral icterus.       Right eye: No discharge.        Left eye: No discharge.     Extraocular Movements: Extraocular movements intact.     Pupils: Pupils are equal, round, and reactive to light.  Cardiovascular:     Rate and Rhythm: Normal rate and regular rhythm.     Pulses: Normal pulses.     Heart sounds: Normal heart sounds. No murmur heard.    No friction rub. No gallop.  Pulmonary:     Effort: Pulmonary effort is normal. No respiratory distress.     Breath sounds: Normal breath sounds.  Abdominal:     General: Abdomen is flat. Bowel sounds are normal. There is no distension.     Palpations: Abdomen is  soft.     Tenderness: There is no abdominal tenderness.  Skin:    General: Skin is warm and dry.     Coloration: Skin is not jaundiced.  Neurological:     Mental Status: He is alert. Mental status is at baseline.     Coordination: Coordination normal.     ED Results / Procedures / Treatments   Labs (all labs ordered are listed, but only abnormal results are displayed) Labs Reviewed  GROUP A STREP BY PCR  RESP PANEL BY RT-PCR (RSV, FLU A&B, COVID)  RVPGX2  CBC WITH DIFFERENTIAL/PLATELET  BASIC METABOLIC PANEL    EKG None  Radiology No results found.  Procedures Procedures    Medications Ordered in ED Medications  dexamethasone (DECADRON) injection 10 mg (has no administration in time range)  ampicillin-sulbactam (UNASYN) 1.5 g in sodium chloride 0.9 % 100 mL IVPB (has no administration in time range)  acetaminophen (TYLENOL) tablet 1,000 mg (1,000 mg Oral Given 11/06/22 1943)    ED Course/ Medical Decision Making/ A&P  Medical Decision Making Amount and/or Complexity of Data Reviewed Labs: ordered. Radiology: ordered.  Risk OTC drugs. Prescription drug management.   Patient presents to the emergency department due to pharyngitis.  I am concerned about PTA and retropharyngeal abscess primarily.  There is uvular deviation and right PTA.  He also has trismus and abnormal phonation.  Will proceed with labs, strep test, empiric antibiotics, Decadron and fluids and CT soft tissue neck to evaluate for retropharyngeal abscess.  Strep test is positive, patient has a leukocytosis of 20, left shift.  BMP with mild hypokalemia with a potassium of 3.2, no AKI.  COVID is negative.  CT soft tissue is pending at time of signout.  Disposition is pending CT result        Final Clinical Impression(s) / ED Diagnoses Final diagnoses:  None    Rx / DC Orders ED Discharge Orders     None         Theron Arista, PA-C 11/07/22 1158     Rolan Bucco, MD 11/11/22 [phone]

## 2022-11-06 NOTE — ED Notes (Signed)
Pt had no complaints with drinking water

## 2022-11-06 NOTE — ED Provider Notes (Signed)
Accepted handoff at shift change from [facility] [place]. Please see prior provider note for more detail.   Briefly: Patient is 37 y.o. presenting for 3 days of sore throat and fever.  Also having difficulty talking and opening his mouth.  Physical exam concerning for peritonsillar abscess and/or retropharyngeal abscess.  DDX: concern for pharyngitis, peritonsillar abscess, retropharyngeal abscess, upper airway obstruction  Plan: Follow-up CT neck.  Dispo based on results scan.   Physical Exam  BP 136/85   Pulse 87   Temp (!) 102.9 F (39.4 C) (Oral)   Resp 18   Ht 6\' 3"  (1.905 m)   Wt 113.4 kg   SpO2 97%   BMI 31.25 kg/m   Physical Exam  Procedures  Procedures  ED Course / MDM    Medical Decision Making Amount and/or Complexity of Data Reviewed Labs: ordered. Radiology: ordered.  Risk OTC drugs. Prescription drug management.   CT of the neck revealed concern for acute tonsillitis and early abscess formation.  Reassessed patient, patient states symptoms have improved since treatment.  Fluid challenge without complication.  Sent clindamycin to his pharmacy for antibiotic treatment.  Advised to follow-up with ENT.       Gareth Eagle, PA-C 11/06/22 2320    Rolan Bucco, MD 11/06/22 [phone]

## 2022-11-06 NOTE — ED Triage Notes (Signed)
Pt presents to ED for sore throat and swelling with headache x3 days.

## 2023-01-20 ENCOUNTER — Encounter (HOSPITAL_COMMUNITY): Payer: Self-pay

## 2023-01-20 ENCOUNTER — Emergency Department (HOSPITAL_COMMUNITY): Payer: Medicare Other

## 2023-01-20 ENCOUNTER — Other Ambulatory Visit: Payer: Self-pay

## 2023-01-20 ENCOUNTER — Emergency Department (HOSPITAL_COMMUNITY)
Admission: EM | Admit: 2023-01-20 | Discharge: 2023-01-20 | Disposition: A | Discharge status: Home / Self Care | Payer: Medicare Other | Attending: Emergency Medicine | Admitting: Emergency Medicine

## 2023-01-20 DIAGNOSIS — S0993XA Unspecified injury of face, initial encounter: Secondary | ICD-10-CM | POA: Diagnosis present

## 2023-01-20 DIAGNOSIS — S8992XA Unspecified injury of left lower leg, initial encounter: Secondary | ICD-10-CM | POA: Diagnosis not present

## 2023-01-20 DIAGNOSIS — Y9339 Activity, other involving climbing, rappelling and jumping off: Secondary | ICD-10-CM | POA: Diagnosis not present

## 2023-01-20 DIAGNOSIS — S0083XA Contusion of other part of head, initial encounter: Secondary | ICD-10-CM | POA: Insufficient documentation

## 2023-01-20 MED ORDER — ACETAMINOPHEN 500 MG PO TABS
1000.0000 mg | ORAL_TABLET | Freq: Once | ORAL | Status: AC
  Administered 2023-01-20: 1000 mg via ORAL
  Filled 2023-01-20: qty 2

## 2023-01-20 NOTE — Discharge Instructions (Addendum)
Thank you for allowing [place] to take care of you today.  We hope you begin feeling better soon.  To-Do: Please follow-up with your primary doctor. During the workup we noted incidental findings on your imaging that would require you to follow-up with your regular doctor for further evaluation/management:  Small incidental 11 mm lucent bone lesion at the left maxilla  nasal process. This is nonspecific but benign etiology is favored,  such as fibrous dysplasia. A repeat noncontrast Face CT in 6 months  can be used to document stability   For pain control you may take 1000 mg of Tylenol every 8 hours as needed. Please return to the Emergency Department or call 911 if you experience chest pain, shortness of breath, severe pain, severe fever, altered mental status, or have any reason to think that you need emergency medical care.  Thank you again.  Hope you feel better soon.  Department of Emergency Medicine [facility]
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
## 2023-06-30 ENCOUNTER — Ambulatory Visit (INDEPENDENT_AMBULATORY_CARE_PROVIDER_SITE_OTHER)
Admission: EM | Admit: 2023-06-30 | Discharge: 2023-06-30 | Disposition: A | Discharge status: Nursing Home (Includes ICF) | Payer: Medicare Other | Source: Home / Self Care

## 2023-06-30 ENCOUNTER — Other Ambulatory Visit (INDEPENDENT_AMBULATORY_CARE_PROVIDER_SITE_OTHER)
Admission: EM | Admit: 2023-06-30 | Discharge: 2023-07-03 | Disposition: A | Discharge status: Rehabilitation Facility | Payer: Medicare Other | Source: Home / Self Care | Admitting: Psychiatry

## 2023-06-30 ENCOUNTER — Ambulatory Visit (HOSPITAL_COMMUNITY)
Admission: EM | Admit: 2023-06-30 | Discharge: 2023-06-30 | Disposition: A | Discharge status: Home / Self Care | Payer: Medicare Other | Attending: Behavioral Health | Admitting: Behavioral Health

## 2023-06-30 DIAGNOSIS — F1010 Alcohol abuse, uncomplicated: Secondary | ICD-10-CM | POA: Insufficient documentation

## 2023-06-30 DIAGNOSIS — F17200 Nicotine dependence, unspecified, uncomplicated: Secondary | ICD-10-CM

## 2023-06-30 DIAGNOSIS — F1510 Other stimulant abuse, uncomplicated: Secondary | ICD-10-CM | POA: Insufficient documentation

## 2023-06-30 DIAGNOSIS — F1110 Opioid abuse, uncomplicated: Secondary | ICD-10-CM | POA: Insufficient documentation

## 2023-06-30 DIAGNOSIS — Z91148 Patient's other noncompliance with medication regimen for other reason: Secondary | ICD-10-CM

## 2023-06-30 DIAGNOSIS — F339 Major depressive disorder, recurrent, unspecified: Secondary | ICD-10-CM | POA: Insufficient documentation

## 2023-06-30 DIAGNOSIS — F1210 Cannabis abuse, uncomplicated: Secondary | ICD-10-CM | POA: Insufficient documentation

## 2023-06-30 DIAGNOSIS — F17210 Nicotine dependence, cigarettes, uncomplicated: Secondary | ICD-10-CM | POA: Insufficient documentation

## 2023-06-30 DIAGNOSIS — F1090 Alcohol use, unspecified, uncomplicated: Secondary | ICD-10-CM | POA: Diagnosis not present

## 2023-06-30 DIAGNOSIS — Z79899 Other long term (current) drug therapy: Secondary | ICD-10-CM | POA: Insufficient documentation

## 2023-06-30 DIAGNOSIS — F1190 Opioid use, unspecified, uncomplicated: Secondary | ICD-10-CM

## 2023-06-30 DIAGNOSIS — F1290 Cannabis use, unspecified, uncomplicated: Secondary | ICD-10-CM | POA: Diagnosis not present

## 2023-06-30 DIAGNOSIS — F1910 Other psychoactive substance abuse, uncomplicated: Secondary | ICD-10-CM | POA: Insufficient documentation

## 2023-06-30 LAB — POCT URINE DRUG SCREEN - MANUAL ENTRY (I-SCREEN)
POC Amphetamine UR: NOT DETECTED
POC Buprenorphine (BUP): NOT DETECTED
POC Cocaine UR: POSITIVE — AB
POC Marijuana UR: POSITIVE — AB
POC Methadone UR: NOT DETECTED
POC Methamphetamine UR: NOT DETECTED
POC Morphine: NOT DETECTED
POC Oxazepam (BZO): NOT DETECTED
POC Oxycodone UR: NOT DETECTED
POC Secobarbital (BAR): NOT DETECTED

## 2023-06-30 MED ORDER — THIAMINE MONONITRATE 100 MG PO TABS
100.0000 mg | ORAL_TABLET | Freq: Every day | ORAL | Status: DC
  Administered 2023-07-01 – 2023-07-02 (×2): 100 mg via ORAL
  Filled 2023-06-30 (×2): qty 1

## 2023-06-30 MED ORDER — NAPROXEN 500 MG PO TABS: 500.0000 mg | ORAL_TABLET | Freq: Two times a day (BID) | ORAL | Status: DC | PRN

## 2023-06-30 MED ORDER — CLONIDINE HCL 0.1 MG PO TABS: 0.1000 mg | ORAL_TABLET | Freq: Every day | ORAL | Status: DC

## 2023-06-30 MED ORDER — ONDANSETRON 4 MG PO TBDP: 4.0000 mg | ORAL_TABLET | Freq: Four times a day (QID) | ORAL | Status: DC | PRN

## 2023-06-30 MED ORDER — OLANZAPINE 10 MG PO TBDP: 10.0000 mg | ORAL_TABLET | Freq: Three times a day (TID) | ORAL | Status: DC | PRN

## 2023-06-30 MED ORDER — LOPERAMIDE HCL 2 MG PO CAPS: 2.0000 mg | ORAL_CAPSULE | ORAL | Status: DC | PRN

## 2023-06-30 MED ORDER — ACETAMINOPHEN 325 MG PO TABS: 650.0000 mg | ORAL_TABLET | Freq: Four times a day (QID) | ORAL | Status: DC | PRN

## 2023-06-30 MED ORDER — LORAZEPAM 1 MG PO TABS
1.0000 mg | ORAL_TABLET | Freq: Four times a day (QID) | ORAL | Status: DC
  Administered 2023-06-30: 1 mg via ORAL
  Filled 2023-06-30: qty 1

## 2023-06-30 MED ORDER — MAGNESIUM HYDROXIDE 400 MG/5ML PO SUSP: 30.0000 mL | Freq: Every day | ORAL | Status: DC | PRN

## 2023-06-30 MED ORDER — HYDROXYZINE HCL 25 MG PO TABS: 25.0000 mg | ORAL_TABLET | Freq: Four times a day (QID) | ORAL | Status: DC | PRN

## 2023-06-30 MED ORDER — CLONIDINE HCL 0.1 MG PO TABS: 0.1000 mg | ORAL_TABLET | ORAL | Status: DC

## 2023-06-30 MED ORDER — CLONIDINE HCL 0.1 MG PO TABS
0.1000 mg | ORAL_TABLET | Freq: Four times a day (QID) | ORAL | Status: DC
  Administered 2023-06-30: 0.1 mg via ORAL
  Filled 2023-06-30: qty 1

## 2023-06-30 MED ORDER — THIAMINE HCL 100 MG/ML IJ SOLN
100.0000 mg | Freq: Once | INTRAMUSCULAR | Status: AC
  Administered 2023-06-30: 100 mg via INTRAMUSCULAR
  Filled 2023-06-30: qty 2

## 2023-06-30 MED ORDER — ADULT MULTIVITAMIN W/MINERALS CH
1.0000 | ORAL_TABLET | Freq: Every day | ORAL | Status: DC
  Administered 2023-07-01 – 2023-07-02 (×2): 1 via ORAL
  Filled 2023-06-30 (×2): qty 1

## 2023-06-30 MED ORDER — DICYCLOMINE HCL 20 MG PO TABS: 20.0000 mg | ORAL_TABLET | Freq: Four times a day (QID) | ORAL | Status: DC | PRN

## 2023-06-30 MED ORDER — ALUM & MAG HYDROXIDE-SIMETH 200-200-20 MG/5ML PO SUSP: 30.0000 mL | ORAL | Status: DC | PRN

## 2023-06-30 MED ORDER — LORAZEPAM 1 MG PO TABS: 1.0000 mg | ORAL_TABLET | Freq: Two times a day (BID) | ORAL | Status: DC

## 2023-06-30 MED ORDER — ZIPRASIDONE MESYLATE 20 MG IM SOLR: 20.0000 mg | INTRAMUSCULAR | Status: DC | PRN

## 2023-06-30 MED ORDER — LORAZEPAM 1 MG PO TABS: 1.0000 mg | ORAL_TABLET | ORAL | Status: DC | PRN

## 2023-06-30 MED ORDER — LORAZEPAM 1 MG PO TABS: 1.0000 mg | ORAL_TABLET | Freq: Four times a day (QID) | ORAL | Status: DC | PRN

## 2023-06-30 MED ORDER — LORAZEPAM 1 MG PO TABS: 1.0000 mg | ORAL_TABLET | Freq: Three times a day (TID) | ORAL | Status: DC

## 2023-06-30 MED ORDER — LORAZEPAM 1 MG PO TABS: 1.0000 mg | ORAL_TABLET | Freq: Every day | ORAL | Status: DC

## 2023-06-30 MED ORDER — METHOCARBAMOL 500 MG PO TABS: 500.0000 mg | ORAL_TABLET | Freq: Three times a day (TID) | ORAL | Status: DC | PRN
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
## 2023-06-30 NOTE — ED Provider Notes (Cosign Needed Addendum)
Facility Based Crisis Admission H&P  Date: 07/01/23 Patient Name: Kevin Gentry MRN: [id number] Chief Complaint: needing treatment for alcohol abuse  Diagnoses:  Final diagnoses:  Polysubstance abuse (HCC)  Alcohol abuse  Recurrent major depressive disorder, remission status unspecified (HCC)  H/O medication noncompliance    HPI: Kevin Gentry, 37 y/o male with a history of alcohol abuse, polysubstance abuse (opioid medication, ecstasy, marijuana), major depressive disorder, OD.Marland Kitchen  Presented to [facility] voluntarily seeking rehab for alcohol and polysubstance abuse.  Patient was seen today however patient left and now patient is back.  Per the patient he drinks alcohol on a daily basis when asked how much alcohol he consumed patient stated he drinks as much as he can, patient also reports he used ecstasy marijuana and opioid medications.  Patient reports he was prescribed medications for his mental health but he does not take them.  Patient denies having a psychiatrist or therapist at this time.  According to the patient he is currently unemployed, lives alone.   Please see prior notes:    Face-to-face evaluation of patient, patient is alert and oriented x 4, speech is clear, maintaining eye contact.  Patient showing no sign of distress, patient is fairly groomed, denies SI, HI, AVH or paranoia.  Pt report he drinks alcohol daily,  he consume as much as possible,  he also report using ecstasy,  marijuana and  pain pills. Denies access to guns denies wanting to hurt himself or others.  According to the patient when he came today he left because he needed to go get some food to eat and he did not want the food that was here.   PHQ9 completed,  pt scored a 11 (moderate depression)   Recommend inpatient [facility]  PHQ 2-9:  Flowsheet Row ED from 06/30/2023 in [facility]  Thoughts that you would be better off dead, or of hurting yourself in some way Not at all  PHQ-9 Total  Score 11       Flowsheet Row ED from 06/30/2023 in [facility] Most recent reading at 07/01/2023 12:13 AM ED from 06/30/2023 in [facility] Most recent reading at 06/30/2023  9:30 PM ED from 06/30/2023 in [facility] Most recent reading at 06/30/2023  1:42 PM  C-SSRS RISK CATEGORY No Risk No Risk No Risk         Total Time spent with patient: 20 minutes  Musculoskeletal  Strength & Muscle Tone: within normal limits Gait & Station: normal Patient leans: N/A  Psychiatric Specialty Exam  Presentation General Appearance:  Casual; Fairly Groomed  Eye Contact: Good  Speech: Clear and Coherent  Speech Volume: Normal  Handedness: Right   Mood and Affect  Mood: Euthymic  Affect: Congruent   Thought Process  Thought Processes: Coherent  Descriptions of Associations:Intact  Orientation:Full (Time, Place and Person)  Thought Content:WDL    Hallucinations:Hallucinations: None  Ideas of Reference:None  Suicidal Thoughts:Suicidal Thoughts: No  Homicidal Thoughts:Homicidal Thoughts: No   Sensorium  Memory: Immediate Good  Judgment: Fair  Insight: Good   Executive Functions  Concentration: Fair  Attention Span: Good  Recall: Good  Fund of Knowledge: Good  Language: Good   Psychomotor Activity  Psychomotor Activity: Psychomotor Activity: Normal   Assets  Assets: Desire for Improvement; Resilience   Sleep  Sleep: Sleep: Fair Number of Hours of Sleep: 6   Nutritional Assessment (For OBS and FBC admissions only) Has the patient had  a weight loss or gain of 10 pounds or more in the last 3 months?: No Has the patient had a decrease in food intake/or appetite?: No Does the patient have dental problems?: No Does the patient have eating habits or behaviors that may be indicators of an eating disorder including binging or inducing vomiting?:  No Has the patient recently lost weight without trying?: 0 Has the patient been eating poorly because of a decreased appetite?: 0 Malnutrition Screening Tool Score: 0    Physical Exam HENT:     Head: Normocephalic.     Nose: Nose normal.  Eyes:     Pupils: Pupils are equal, round, and reactive to light.  Cardiovascular:     Rate and Rhythm: Normal rate.  Pulmonary:     Effort: Pulmonary effort is normal.  Musculoskeletal:        General: Normal range of motion.     Cervical back: Normal range of motion.  Neurological:     General: No focal deficit present.     Mental Status: He is alert.  Psychiatric:        Mood and Affect: Mood normal.        Behavior: Behavior normal.        Thought Content: Thought content normal.        Judgment: Judgment normal.    Review of Systems  Constitutional: Negative.   HENT: Negative.    Eyes: Negative.   Respiratory: Negative.    Cardiovascular: Negative.   Gastrointestinal: Negative.   Genitourinary: Negative.   Musculoskeletal: Negative.   Skin: Negative.   Neurological: Negative.   Psychiatric/Behavioral:  Positive for substance abuse. The patient is nervous/anxious.     Blood pressure (!) 127/98, pulse 95, temperature 98.3 F (36.8 C), temperature source Oral, resp. rate 18, SpO2 98%. There is no height or weight on file to calculate BMI.  Past Psychiatric History:  Bipolar dx, alcohol abuse, OD, substance abuse  Is the patient at risk to self? No  Has the patient been a risk to self in the past 6 months? No .    Has the patient been a risk to self within the distant past? Yes   Is the patient a risk to others? No   Has the patient been a risk to others in the past 6 months? No   Has the patient been a risk to others within the distant past? No   Past Medical History: see chart  Family History: unknown  Social History: alcohol,  marijuana,  opioid pills   Last Labs:  Admission on 06/30/2023  Component Date Value Ref  Range Status   WBC 06/30/2023 12.0 (H)  4.0 - 10.5 K/uL Final   RBC 06/30/2023 5.11  4.22 - 5.81 MIL/uL Final   Hemoglobin 06/30/2023 14.5  13.0 - 17.0 g/dL Final   HCT 40/98/1191 44.4  39.0 - 52.0 % Final   MCV 06/30/2023 86.9  80.0 - 100.0 fL Final   MCH 06/30/2023 28.4  26.0 - 34.0 pg Final   MCHC 06/30/2023 32.7  30.0 - 36.0 g/dL Final   RDW 47/82/9562 12.6  11.5 - 15.5 % Final   Platelets 06/30/2023 361  150 - 400 K/uL Final   nRBC 06/30/2023 0.0  0.0 - 0.2 % Final   Neutrophils Relative % 06/30/2023 71  % Final   Neutro Abs 06/30/2023 8.8 (H)  1.7 - 7.7 K/uL Final   Lymphocytes Relative 06/30/2023 19  % Final   Lymphs Abs 06/30/2023  2.2  0.7 - 4.0 K/uL Final   Monocytes Relative 06/30/2023 7  % Final   Monocytes Absolute 06/30/2023 0.8  0.1 - 1.0 K/uL Final   Eosinophils Relative 06/30/2023 1  % Final   Eosinophils Absolute 06/30/2023 0.1  0.0 - 0.5 K/uL Final   Basophils Relative 06/30/2023 1  % Final   Basophils Absolute 06/30/2023 0.1  0.0 - 0.1 K/uL Final   Immature Granulocytes 06/30/2023 1  % Final   Abs Immature Granulocytes 06/30/2023 0.06  0.00 - 0.07 K/uL Final   Performed at [facility], [address] [place], [place] [address]   Sodium 06/30/2023 142  135 - 145 mmol/L Final   Potassium 06/30/2023 4.4  3.5 - 5.1 mmol/L Final   Chloride 06/30/2023 103  98 - 111 mmol/L Final   CO2 06/30/2023 28  22 - 32 mmol/L Final   Glucose, Bld 06/30/2023 102 (H)  70 - 99 mg/dL Final   Glucose reference range applies only to samples taken after fasting for at least 8 hours.   BUN 06/30/2023 14  6 - 20 mg/dL Final   Creatinine, Ser 06/30/2023 1.11  0.61 - 1.24 mg/dL Final   Calcium 69/62/9528 9.7  8.9 - 10.3 mg/dL Final   Total Protein 41/32/4401 7.1  6.5 - 8.1 g/dL Final   Albumin 02/72/5366 4.0  3.5 - 5.0 g/dL Final   AST 44/09/4740 18  15 - 41 U/L Final   ALT 06/30/2023 21  0 - 44 U/L Final   Alkaline Phosphatase 06/30/2023 65  38 - 126 U/L Final   Total Bilirubin  06/30/2023 0.5  <1.2 mg/dL Final   GFR, Estimated 06/30/2023 >60  >60 mL/min Final   Comment: (NOTE) Calculated using the CKD-EPI Creatinine Equation (2021)    Anion gap 06/30/2023 11  5 - 15 Final   Performed at [facility], [address], [place], [place] [address]   Alcohol, Ethyl (B) 06/30/2023 <10  <10 mg/dL Final   Comment: (NOTE) Lowest detectable limit for serum alcohol is 10 mg/dL.  For medical purposes only. Performed at [facility], [address], [place], [place] [address]    TSH 06/30/2023 1.074  0.350 - 4.500 uIU/mL Final   Comment: Performed by a 3rd Generation assay with a functional sensitivity of <=0.01 uIU/mL. Performed at [facility], [address], [place], [place] [address]    POC Amphetamine UR 06/30/2023 None Detected  NONE DETECTED (Cut Off Level 1000 ng/mL) Final   POC Secobarbital (BAR) 06/30/2023 None Detected  NONE DETECTED (Cut Off Level 300 ng/mL) Final   POC Buprenorphine (BUP) 06/30/2023 None Detected  NONE DETECTED (Cut Off Level 10 ng/mL) Final   POC Oxazepam (BZO) 06/30/2023 None Detected  NONE DETECTED (Cut Off Level 300 ng/mL) Final   POC Cocaine UR 06/30/2023 Positive (A)  NONE DETECTED (Cut Off Level 300 ng/mL) Final   POC Methamphetamine UR 06/30/2023 None Detected  NONE DETECTED (Cut Off Level 1000 ng/mL) Final   POC Morphine 06/30/2023 None Detected  NONE DETECTED (Cut Off Level 300 ng/mL) Final   POC Methadone UR 06/30/2023 None Detected  NONE DETECTED (Cut Off Level 300 ng/mL) Final   POC Oxycodone UR 06/30/2023 None Detected  NONE DETECTED (Cut Off Level 100 ng/mL) Final   POC Marijuana UR 06/30/2023 Positive (A)  NONE DETECTED (Cut Off Level 50 ng/mL) Final    Allergies: Patient has no known allergies.  Medications:  Facility Ordered Medications  Medication   acetaminophen (TYLENOL) tablet 650 mg   alum & mag hydroxide-simeth (MAALOX/MYLANTA) 200-200-20 MG/5ML suspension 30 mL   magnesium hydroxide (MILK OF  MAGNESIA) suspension 30 mL   dicyclomine (BENTYL) tablet 20 mg   hydrOXYzine (ATARAX) tablet 25 mg   loperamide (IMODIUM) capsule 2-4 mg   methocarbamol (ROBAXIN) tablet 500 mg   naproxen (NAPROSYN) tablet 500 mg   ondansetron (ZOFRAN-ODT) disintegrating tablet 4 mg   [COMPLETED] thiamine (VITAMIN B1) injection 100 mg   thiamine (VITAMIN B1) tablet 100 mg   multivitamin with minerals tablet 1 tablet   LORazepam (ATIVAN) tablet 1 mg   hydrOXYzine (ATARAX) tablet 25 mg   cloNIDine (CATAPRES) tablet 0.1 mg   Followed by   Melene Muller ON 07/02/2023] cloNIDine (CATAPRES) tablet 0.1 mg   Followed by   Melene Muller ON 07/05/2023] cloNIDine (CATAPRES) tablet 0.1 mg   LORazepam (ATIVAN) tablet 1 mg   Followed by   LORazepam (ATIVAN) tablet 1 mg   Followed by   Melene Muller ON 07/02/2023] LORazepam (ATIVAN) tablet 1 mg   Followed by   Melene Muller ON 07/04/2023] LORazepam (ATIVAN) tablet 1 mg   OLANZapine zydis (ZYPREXA) disintegrating tablet 10 mg   And   LORazepam (ATIVAN) tablet 1 mg   And   ziprasidone (GEODON) injection 20 mg   PTA Medications  Medication Sig   clindamycin (CLEOCIN) 150 MG capsule Take 1 capsule (150 mg total) by mouth every 6 (six) hours.    Long Term Goals: Improvement in symptoms so as ready for discharge  Short Term Goals: Patient will verbalize feelings in meetings with treatment team members., Patient will attend at least of 50% of the groups daily., Pt will complete the PHQ9 on admission, day 3 and discharge., Patient will participate in completing the [place] Suicide Severity Rating Scale, Patient will score a low risk of violence for 24 hours prior to discharge, and Patient will take medications as prescribed daily.  Medical Decision Making  Inpatient [facility]   Lab Orders  No laboratory test(s) ordered today     Recommendations  Based on my evaluation the patient does not appear to have an emergency medical condition.  Sindy Guadeloupe, NP 07/01/23  5:48 AM

## 2023-06-30 NOTE — ED Provider Notes (Addendum)
Behavioral Health Urgent Care Medical Screening Exam  Patient Name: Kevin MICHONSKI MRN: [id number] Date of Evaluation: 06/30/23 Chief Complaint:   Diagnosis:  Final diagnoses:  None    History of Present illness: Kevin Gentry is a 37 y.o. male, presented voluntarily to [facility], accompanied by his father.  It was noted that the patient has a history of substance abuse, as reported by his father. The patient expressed a desire to seek rehabilitation, motivated by the loss of 2 close individuals to drugs in 2019, which has left him feeling guilty.   During today's assessment, the patient reports consuming alcohol, specifically beer and wine, with the last use occurring yesterday in the amount of 1-2 beers. The patient admits to drinking 1 or 2 beers nightly, with the onset of alcohol use beginning at the age of 4.  He stated he has not experienced withdrawal symptoms or seizures.  The patient reports a past psychiatric history of ADHD diagnosed in childhood and believes he was misdiagnosed with bipolar disorder. Patient reports past medications: Adderall and Depakote. The patient reports he is currently not taking any medications.  Patient reports he resides alone in [place] and has 3 children aged 17, 92, and 54.    Sleep and appetite are reported as satisfactory, with no unintentional weight loss. The patient denies any current stressors. The patient reports he is employed part-time at a [employer].  He reports no history of trauma, physical, sexual, or emotional abuse.  The patient reports no military history.  He identifies his support systems as his father and a twin sister residing in [place]. The patient reports seeing a therapist "a long time ago."  The patient denies any medical history.  The patient denies access to guns, past suicide attempts, or inpatient psychiatric hospitalizations.  Patient reports he smokes more than a half a pack of cigarettes  daily but has refused nicotine replacement therapies. Substance use includes marijuana and vaping tobacco and THC with the last use reported as yesterday.   The patient denies current suicidal or homicidal ideations, auditory or visual hallucinations, paranoid ideation, thought insertion.  He also denies symptoms of mania or euphoria.  The patient denies depressive symptoms such as hopelessness, worthlessness, or difficulty concentrating.  He denies anxiety symptoms like excessive worry or restlessness.  The patient also denies any completed suicides or substance abuse in the family.   During the session, the patient expressed a desire to leave, citing his father was waiting in the lobby with [name].  It was explained that as a voluntary patient, he could leave as he did not appear to be an imminent risk to himself or others, nor were there symptoms of psychiatric impairment.  Informed that leaving will require him to undergo the intake process again upon return.  The patient was encouraged to return to urgent care if he continued to seek rehabilitation.  He left prior to receiving paper resources.  Flowsheet Row ED from 06/30/2023 in [facility] ED from 01/20/2023 in [facility] Emergency Department at [facility] ED from 11/06/2022 in [facility] Emergency Department at [facility]  C-SSRS RISK CATEGORY No Risk No Risk No Risk       Psychiatric Specialty Exam  Presentation  General Appearance:Casual; Fairly Groomed  Eye Contact:Good  Speech:Clear and Coherent  Speech Volume:Normal  Handedness:Right   Mood and Affect  Mood:Euthymic  Affect:Congruent   Thought Process  Thought Processes:Coherent; Goal  Directed; Linear  Descriptions of Associations:Intact  Orientation:Full (Time, Place and Person)  Thought Content:Logical    Hallucinations:None  Ideas of Reference:None  Suicidal Thoughts:No  Homicidal  Thoughts:No   Sensorium  Memory:Immediate Good; Recent Good; Remote Good  Judgment:Fair  Insight:Good   Executive Functions  Concentration:Good  Attention Span:Good  Recall:Good  Fund of Knowledge:Good  Language:Good   Psychomotor Activity  Psychomotor Activity:Normal   Assets  Assets:Desire for Improvement; Housing; Resilience; Social Support   Sleep  Sleep:Fair  Number of hours: No data recorded  Physical Exam: Physical Exam Vitals and nursing note reviewed.  Constitutional:      General: He is not in acute distress. HENT:     Head: Normocephalic.     Nose: Nose normal.     Mouth/Throat:     Pharynx: Oropharynx is clear.  Eyes:     Conjunctiva/sclera: Conjunctivae normal.  Cardiovascular:     Pulses: Normal pulses.  Pulmonary:     Effort: Pulmonary effort is normal. No respiratory distress.  Musculoskeletal:        General: Normal range of motion.     Cervical back: Normal range of motion.  Skin:    General: Skin is warm and dry.  Neurological:     General: No focal deficit present.     Mental Status: He is alert and oriented to person, place, and time.  Psychiatric:        Mood and Affect: Mood normal.        Behavior: Behavior normal.    Review of Systems  Constitutional:  Negative for fever and weight loss.  HENT: Negative.    Respiratory: Negative.  Negative for cough and shortness of breath.   Cardiovascular:  Negative for chest pain and palpitations.  Gastrointestinal:  Negative for diarrhea, nausea and vomiting.  Musculoskeletal: Negative.   Neurological: Negative.   Psychiatric/Behavioral:  Positive for substance abuse (Patient reports ETOH abuse).    Blood pressure 133/84, pulse 92, temperature 99.1 F (37.3 C), temperature source Oral, resp. rate 18, SpO2 97%. There is no height or weight on file to calculate BMI.  Musculoskeletal: Strength & Muscle Tone: within normal limits Gait & Station: normal Patient leans:  N/A   [facility] MSE Discharge Disposition for Follow up and Recommendations: Patient left prior to receiving paper resources.   Verbally recommended patient return to Urgent Care if he continues to seek rehabilitation.    Norma Fredrickson, NP 06/30/2023, 3:59 PM

## 2023-06-30 NOTE — BH Assessment (Addendum)
Comprehensive Clinical Assessment (CCA) Note  07/01/2023 Kevin Gentry [id number] Disposition: Patient was triaged actually twice today at [facility].  Once by Lavetta Nielsen.  Pt left then came back later and was triaged by Lambert Mody, NT.  This clinician completed the CCA.  Pt was seen the 2nd time by Sindy Guadeloupe, NP who did the MSE.  Patient was recommended by Channing Mutters for [facility].    Patient is guarded in his responses.  He has fair eye contact.  Patient is oriented x5.  He is not responding to internal stimuli nor does he display delusional thought problems.  Patient speaks in a low but even tone.  He asks about food.  Sleep is disturbed by his excessive ETOH use.  Patient has shown up at worksites drunk.    Patient has prescriptions for psychiatric meds but has not been taking them for a long time.  No current outpatient provider.     Chief Complaint: No chief complaint on file.  Visit Diagnosis: Poly substance use    CCA Screening, Triage and Referral (STR)  Patient Reported Information How did you hear about [place]? Family/Friend  What Is the Reason for Your Visit/Call Today? Pt presents to [facility] voluntarily accompanied by his father. Per dad, pt has problems with substance abuse. Per dad, pt asked for help with getting himself together because he last 2 very close people in 2019 to drugs and he feels guilty. Pt denies SI, HI, AVH and drug use at this present time. Pt states that on yesterday he consumed as much alcohol (beer, wine and liquor) that he could get and his body would hold. Pt states that he doesn't have a therapist at this time.  He admits to using ETOH, THC, ecstasy and pain pills.  He says he may drink four 40's, may drink up to a 5th of liquor and fortified wine usually about 40 oz.  He will drink a combination of these ETOH beverages in a day.  Smokes about 3.5 grams in 3 days or so.  Pain pills and ecstacsy is "whenever I get my hands on it.."  How Long Has This Been Causing You  Problems? > than 6 months  What Do You Feel Would Help You the Most Today? Alcohol or Drug Use Treatment; Social Support   Have You Recently Had Any Thoughts About Hurting Yourself? No  Are You Planning to Commit Suicide/Harm Yourself At This time? No   Flowsheet Row ED from 06/30/2023 in [facility] Most recent reading at 07/01/2023 12:13 AM ED from 06/30/2023 in [facility] Most recent reading at 06/30/2023  9:30 PM ED from 06/30/2023 in [facility] Most recent reading at 06/30/2023  1:42 PM  C-SSRS RISK CATEGORY No Risk No Risk No Risk       Have you Recently Had Thoughts About Hurting Someone Karolee Ohs? No  Are You Planning to Harm Someone at This Time? No  Explanation: No SI or HI.   Have You Used Any Alcohol or Drugs in the Past 24 Hours? Yes  What Did You Use and How Much? alcohol - beer, wine & liquor (consumed as much as he could get and his body could hold)   Do You Currently Have a Therapist/Psychiatrist? No data recorded Name of Therapist/Psychiatrist: Name of Therapist/Psychiatrist: None   Have You Been Recently Discharged From Any Office Practice or Programs? No  Explanation of Discharge From Practice/Program: No recent discharges  CCA Screening Triage Referral Assessment Type of Contact: Face-to-Face  Telemedicine Service Delivery:   Is this Initial or Reassessment?   Date Telepsych consult ordered in [facility]:    Time Telepsych consult ordered in [facility]:    Location of Assessment: [facility] Assessment Services  Provider Location: [facility] Assessment Services   Collateral Involvement: None duering CCA.   Does Patient Have a Automotive engineer Guardian? No  Legal Guardian Contact Information: Patient has no legal guardian.  Copy of Legal Guardianship Form: -- (Patient has no legal guardian.)  Legal Guardian Notified of Arrival: -- (Patient has no legal  guardian.)  Legal Guardian Notified of Pending Discharge: -- (Patient has no legal guardian.)  If Minor and Not Living with Parent(s), Who has Custody? Pt is a adult  Is CPS involved or ever been involved? Never  Is APS involved or ever been involved? Never   Patient Determined To Be At Risk for Harm To Self or Others Based on Review of Patient Reported Information or Presenting Complaint? No  Method: No Plan  Availability of Means: No access or NA  Intent: Vague intent or NA  Notification Required: No need or identified person  Additional Information for Danger to Others Potential: -- (Pt presents with no SI or HI.)  Additional Comments for Danger to Others Potential: No HI  Are There Guns or Other Weapons in Your Home? No  Types of Guns/Weapons: None  Are These Weapons Safely Secured?                            No  Who Could Verify You Are Able To Have These Secured: No weapons  Do You Have any Outstanding Charges, Pending Court Dates, Parole/Probation? Unknown  Contacted To Inform of Risk of Harm To Self or Others: Other: Comment (No need.)    Does Patient Present under Involuntary Commitment? No    [place] of Residence: [place]   Patient Currently Receiving the Following Services: Not Receiving Services   Determination of Need: Urgent (48 hours)   Options For Referral: Facility-Based Crisis     CCA Biopsychosocial Patient Reported Schizophrenia/Schizoaffective Diagnosis in Past: No   Strengths: Canot identify any   Mental Health Symptoms Depression:  Hopelessness; Fatigue; Difficulty Concentrating   Duration of Depressive symptoms: Duration of Depressive Symptoms: Greater than two weeks   Mania:  None   Anxiety:   Worrying; Tension   Psychosis:  None   Duration of Psychotic symptoms:    Trauma:  Detachment from others; Irritability/anger   Obsessions:  Attempts to suppress/neutralize; Disrupts routine/functioning   Compulsions:   Disrupts with routine/functioning; Intrusive/time consuming   Inattention:  N/A   Hyperactivity/Impulsivity:  N/A   Oppositional/Defiant Behaviors:  N/A   Emotional Irregularity:  Chronic feelings of emptiness   Other Mood/Personality Symptoms:  Substance use issues.    Mental Status Exam Appearance and self-care  Stature:  Tall   Weight:  Average weight   Clothing:  Casual   Grooming:  Normal   Cosmetic use:  None   Posture/gait:  Normal   Motor activity:  Not Remarkable   Sensorium  Attention:  Normal   Concentration:  Normal   Orientation:  X5   Recall/memory:  Normal   Affect and Mood  Affect:  Appropriate; Full Range   Mood:  Euthymic   Relating  Eye contact:  Normal   Facial expression:  Depressed   Attitude toward examiner:  Cooperative; Guarded  Thought and Language  Speech flow: Clear and Coherent   Thought content:  Appropriate to Mood and Circumstances   Preoccupation:  None   Hallucinations:  None   Organization:  Coherent; Development worker, international aid of Knowledge:  Average   Intelligence:  Average   Abstraction:  Normal   Judgement:  Poor   Reality Testing:  Realistic   Insight:  Fair   Decision Making:  Impulsive   Social Functioning  Social Maturity:  Impulsive   Social Judgement:  Heedless; Impropriety   Stress  Stressors:  Illness; Financial; Relationship; Work   Coping Ability:  Normal   Skill Deficits:  Scientist, physiological; Self-control   Supports:  Family     Religion: Religion/Spirituality How Might This Affect Treatment?: No affect on treatment  Leisure/Recreation: Leisure / Recreation Do You Have Hobbies?: No  Exercise/Diet: Exercise/Diet Do You Exercise?: No Have You Gained or Lost A Significant Amount of Weight in the Past Six Months?: No Do You Follow a Special Diet?: No Do You Have Any Trouble Sleeping?: Yes Explanation of Sleeping Difficulties: Drinks to get drunk and to  sleep.   CCA Employment/Education Employment/Work Situation:    Education:     CCA Family/Childhood History Family and Relationship History:    Childhood History:          CCA Substance Use Alcohol/Drug Use: Alcohol / Drug Use History of alcohol / drug use?: Yes Substance #1 Name of Substance 1: ETOH.  Pt will drink liquor, beer and wine.  "as much as my body can stand" 1 - Age of First Use: 37 years of age 48 - Amount (size/oz): Liquor wil drink up to a 5th.  Beer will drink four 40's.  Wine will drink 40oz of fortified wine 1 - Frequency: Daily, in varying amounts 1 - Duration: ongoing 1 - Last Use / Amount: earlier today (12/18) 1 - Method of Aquiring: Purchase 1- Route of Use: oral Substance #2 Name of Substance 2: Marijuana 2 - Age of First Use: 37 years of age 30 - Amount (size/oz): 3 grams in three days 2 - Frequency: Daily use. 2 - Duration: ongoing 2 - Last Use / Amount: Today (12/18) 2 - Method of Aquiring: illegal purchase 2 - Route of Substance Use: inhalation Substance #3 Name of Substance 3: Pain killers and ecstasy (pt could not specifically identify anything but oxycodone) 3 - Age of First Use: 37 years of age 31 - Amount (size/oz): Varies "whatever I get from others" 3 - Frequency: Varies according to what other people give him.  Patient vague. 3 - Duration: off and on 3 - Last Use / Amount: Pt couldn't recall 3 - Method of Aquiring: illegal purchase 3 - Route of Substance Use: oral                   ASAM's:  Six Dimensions of Multidimensional Assessment  Dimension 1:  Acute Intoxication and/or Withdrawal Potential:      Dimension 2:  Biomedical Conditions and Complications:      Dimension 3:  Emotional, Behavioral, or Cognitive Conditions and Complications:     Dimension 4:  Readiness to Change:     Dimension 5:  Relapse, Continued use, or Continued Problem Potential:     Dimension 6:  Recovery/Living Environment:     ASAM  Severity Score:    ASAM Recommended Level of Treatment:     Substance use Disorder (SUD)    Recommendations for Services/Supports/Treatments:  Discharge Disposition:    DSM5 Diagnoses: Patient Active Problem List   Diagnosis Date Noted   Alcohol abuse 06/30/2023   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     Referrals to Alternative Service(s): Referred to Alternative Service(s):   Place:   Date:   Time:    Referred to Alternative Service(s):   Place:   Date:   Time:    Referred to Alternative Service(s):   Place:   Date:   Time:    Referred to Alternative Service(s):   Place:   Date:   Time:     Wandra Mannan

## 2023-07-01 ENCOUNTER — Encounter (HOSPITAL_COMMUNITY): Payer: Self-pay | Admitting: Psychiatry

## 2023-07-01 DIAGNOSIS — F1010 Alcohol abuse, uncomplicated: Secondary | ICD-10-CM | POA: Diagnosis not present

## 2023-07-01 DIAGNOSIS — F1910 Other psychoactive substance abuse, uncomplicated: Secondary | ICD-10-CM | POA: Diagnosis not present

## 2023-07-01 DIAGNOSIS — F17200 Nicotine dependence, unspecified, uncomplicated: Secondary | ICD-10-CM | POA: Diagnosis not present

## 2023-07-01 LAB — CBC WITH DIFFERENTIAL/PLATELET
Abs Immature Granulocytes: 0.06 10*3/uL (ref 0.00–0.07)
Basophils Absolute: 0.1 10*3/uL (ref 0.0–0.1)
Basophils Relative: 1 %
Eosinophils Absolute: 0.1 10*3/uL (ref 0.0–0.5)
Eosinophils Relative: 1 %
HCT: 44.4 % (ref 39.0–52.0)
Hemoglobin: 14.5 g/dL (ref 13.0–17.0)
Immature Granulocytes: 1 %
Lymphocytes Relative: 19 %
Lymphs Abs: 2.2 10*3/uL (ref 0.7–4.0)
MCH: 28.4 pg (ref 26.0–34.0)
MCHC: 32.7 g/dL (ref 30.0–36.0)
MCV: 86.9 fL (ref 80.0–100.0)
Monocytes Absolute: 0.8 10*3/uL (ref 0.1–1.0)
Monocytes Relative: 7 %
Neutro Abs: 8.8 10*3/uL — ABNORMAL HIGH (ref 1.7–7.7)
Neutrophils Relative %: 71 %
Platelets: 361 10*3/uL (ref 150–400)
RBC: 5.11 MIL/uL (ref 4.22–5.81)
RDW: 12.6 % (ref 11.5–15.5)
WBC: 12 10*3/uL — ABNORMAL HIGH (ref 4.0–10.5)
nRBC: 0 % (ref 0.0–0.2)

## 2023-07-01 LAB — COMPREHENSIVE METABOLIC PANEL
ALT: 21 U/L (ref 0–44)
AST: 18 U/L (ref 15–41)
Albumin: 4 g/dL (ref 3.5–5.0)
Alkaline Phosphatase: 65 U/L (ref 38–126)
Anion gap: 11 (ref 5–15)
BUN: 14 mg/dL (ref 6–20)
CO2: 28 mmol/L (ref 22–32)
Calcium: 9.7 mg/dL (ref 8.9–10.3)
Chloride: 103 mmol/L (ref 98–111)
Creatinine, Ser: 1.11 mg/dL (ref 0.61–1.24)
GFR, Estimated: >60 mL/min (ref >60)
Glucose, Bld: 102 mg/dL — ABNORMAL HIGH (ref 70–99)
Potassium: 4.4 mmol/L (ref 3.5–5.1)
Sodium: 142 mmol/L (ref 135–145)
Total Bilirubin: 0.5 mg/dL (ref <1.2)
Total Protein: 7.1 g/dL (ref 6.5–8.1)

## 2023-07-01 LAB — ETHANOL: Alcohol, Ethyl (B): <10 mg/dL (ref <10)

## 2023-07-01 LAB — TSH: TSH: 1.074 u[IU]/mL (ref 0.350–4.500)

## 2023-07-01 MED ORDER — MIRTAZAPINE 15 MG PO TABS
15.0000 mg | ORAL_TABLET | Freq: Every day | ORAL | Status: DC
Start: 2023-07-01 — End: 2023-07-03
  Administered 2023-07-01 – 2023-07-02 (×2): 15 mg via ORAL
  Filled 2023-07-01 (×2): qty 1

## 2023-07-01 MED ORDER — NICOTINE 21 MG/24HR TD PT24
21.0000 mg | MEDICATED_PATCH | Freq: Every day | TRANSDERMAL | Status: DC
  Administered 2023-07-01 – 2023-07-02 (×2): 21 mg via TRANSDERMAL
  Filled 2023-07-01 (×2): qty 1

## 2023-07-01 MED ORDER — CHLORDIAZEPOXIDE HCL 25 MG PO CAPS: 25.0000 mg | ORAL_CAPSULE | Freq: Four times a day (QID) | ORAL | Status: DC | PRN

## 2023-07-01 MED ORDER — HYDROXYZINE HCL 25 MG PO TABS: 25.0000 mg | ORAL_TABLET | Freq: Four times a day (QID) | ORAL | Status: DC | PRN

## 2023-07-01 NOTE — ED Notes (Signed)
Patient resting in bedroom in no apparent acute distress. Calm and collected. Environment secured. Safety checks in place according to facility policy.

## 2023-07-01 NOTE — Group Note (Signed)
Group Topic: Wellness  Group Date: 07/01/2023 Start Time: 1145 End Time: 1215 Facilitators: Concha Norway, NT  Department: [facility]  Number of Participants: 5  Group Focus: self-awareness Treatment Modality:  Psychoeducation Interventions utilized were problem solving Purpose: increase insight  Name: Kevin Gentry Date of Birth: September 19, 1985  MR: [id number]    Level of Participation: when cued Quality of Participation: engaged Interactions with others: gave feedback Mood/Affect: appropriate Triggers (if applicable): none Cognition: concrete Progress: Moderate Response: none Plan: follow-up needed  Patients Problems:  Patient Active Problem List   Diagnosis Date Noted   Alcohol abuse 06/30/2023   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

## 2023-07-01 NOTE — ED Notes (Signed)
Pt is in his room resting in bed. Pt denies SI/HI/AVH. No acute distress noted. Will continue to monitor for safety.

## 2023-07-01 NOTE — Group Note (Signed)
Group Topic: Relapse and Recovery  Group Date: 07/01/2023 Start Time: 1330 End Time: 1400 Facilitators: Lance Muss, MD; Harwood Nall, Jacklynn Barnacle, RN  Department: [facility]  Number of Participants: 5  Group Focus: relapse prevention, chemical dependency education, and substance abuse education Treatment Modality:  Psychoeducation Interventions utilized were patient education Purpose: relapse prevention strategies  Name: NYKEL WIBBENMEYER Date of Birth: 06/27/1986  MR: [id number]    Level of Participation: active Quality of Participation: attentive and cooperative Interactions with others: gave feedback Mood/Affect: appropriate Triggers (if applicable): none identified Cognition: coherent/clear Progress: Gaining insight Response: appropriate Plan: patient will be encouraged to continue attending groups and communicate with staff regarding care plan  Writer was a witness to group, not moderator  Patients Problems:  Patient Active Problem List   Diagnosis Date Noted   Alcohol abuse 06/30/2023   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

## 2023-07-01 NOTE — Group Note (Signed)
Group Topic: Change and Accountability  Group Date: 07/01/2023 Start Time: 0730 End Time: 0800 Facilitators: Hilma Favors, RN  Department: [facility]  Number of Participants: 4  Group Focus: personal responsibility Treatment Modality:  Behavior Modification Therapy Interventions utilized were support Purpose: regain self-worth  Name: DRAELYN SERVICE Date of Birth: 09-03-1985  MR: [id number]    Level of Participation: was not available Quality of Participation: n/a Interactions with others: n/a Mood/Affect: n/a Triggers (if applicable): n/a Cognition: n/a Progress: Other Response: n/a Plan: patient will be encouraged to attend when availble.  Patients Problems:  Patient Active Problem List   Diagnosis Date Noted   Alcohol abuse 06/30/2023   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

## 2023-07-01 NOTE — ED Notes (Signed)
Patient alert & oriented x4. Denies intent to harm self or others when asked. Denies A/VH. Patient denies any physical complaints when asked. No acute distress noted. Scheduled medications administered with no complications. Support and encouragement provided. Routine safety checks conducted per facility protocol. Encouraged patient to notify staff if any thoughts of harm towards self or others arise. Patient verbalizes understanding and agreement.

## 2023-07-01 NOTE — ED Notes (Signed)
Patient resting with eyes closed in no apparent acute distress. Respirations even and unlabored. Environment secured. Safety checks in place according to facility policy.

## 2023-07-01 NOTE — ED Notes (Signed)
Patient is sleeping. Respirations equal and unlabored, skin warm and dry. No change in assessment or acuity. Routine safety checks conducted according to facility protocol. Will continue to monitor for safety.   

## 2023-07-01 NOTE — ED Notes (Signed)
Patient sitting in group room interacting with peers and nutrition educator. No acute distress noted. No concerns voiced. Informed patient to notify staff with any needs or assistance. Patient verbalized understanding or agreement. Safety checks in place per facility policy.

## 2023-07-01 NOTE — ED Notes (Signed)
Pt is A & O x 4. Pt is oriented to the unit and provided with meal. Admission process completed. Pt  medication administered. Pt denies SI/HI/AVH. Will continue to monitor for safety and provide support.

## 2023-07-01 NOTE — ED Provider Notes (Signed)
Behavioral Health Progress Note  Date and Time: 07/01/2023 10:06 AM Name: Kevin Gentry MRN:  [id number]  Reason for admission: Detox from alcohol and opiates  Subjective: Jaivin Silerio is a 37 year old male with a past psychiatric history of alcohol use disorder, opioid use disorder, cannabis use disorder, and tobacco use disorder who presents to [facility] for detox and rehabilitation services.  Patient has been drinking alcohol since he was 37 years old.  He reports drinking daily and drinks on average three 40 ounce beers with some liquor.  He states the longest period of sobriety is "right now, the past 2 days".  He states his last drink was on 12/17.  He denies withdrawal history of seizures, hallucinations, and DTs.  He denies previously being in a rehab program.  Reports current withdrawal symptoms of headache and dizziness.  He is looking for residential treatment after detoxing.  He has been using a combination of opioids since he was 37 years old.  He reports using Percocets, oxycodone's, Norco's, and hydrocodone's.  He reports using 4 pills every 2 days.  His method of use is inhalation and oral.  His most recent use was yesterday (this conflicts with the UDS results as it only showed positive marijuana and cocaine).  He denies current withdrawal symptoms.  He reports ecstasy use since he was 37 years old.  He uses about 20 pills every 4 days.  His most recent use was 4 days ago.  He reports tobacco use-averaging about 2 to 3 packs daily.  He reports marijuana use since he was 37 years old stating he uses 1/8-1/4 daily.  He reports using cocaine weekly through snorting with most recent use was 2 days ago.  He denies IV drug use.   Diagnosis:  Final diagnoses:  Tobacco use disorder  Cannabis use disorder  Opioid use disorder  Alcohol use disorder    Total Time spent with patient: 30 minutes  Past Psychiatric History: Unsure, states he previously was on Wellbutrin, Seroquel, Adderall, and  Depakote and stop around 2020.  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  Pt will drink liquor, beer and wine.  "as much as my body can stand" 1 - Age of First Use: 37 years of age 61 - Amount (size/oz): Liquor wil drink up to a 5th.  Beer will drink four 40's.  Wine will drink 40oz of fortified wine 1 - Frequency: Daily, in varying amounts 1 - Duration: ongoing 1 - Last Use / Amount: earlier today (12/18) 1 - Method of Aquiring: Purchase 1- Route of Use: oral Name of Substance 2: Marijuana 2 - Age of First Use: 38 years of age 31 - Amount (size/oz): 3 grams in three days 2 - Frequency: Daily use. 2 - Duration: ongoing 2 - Last Use / Amount: Today (12/18) 2 - Method of Aquiring: illegal purchase 2 - Route of Substance Use: inhalation Name of Substance 3: Pain killers and ecstasy (pt could not specifically identify anything but oxycodone) 3 - Age of First Use: 37 years of age 80 - Amount (size/oz): Varies "whatever I get from others" 3 - Frequency: Varies according to what other people give him.  Patient  vague. 3 - Duration: off and on 3 - Last Use / Amount: Pt couldn't recall 3 - Method of Aquiring: illegal purchase 3 - Route of Substance Use: oral              Sleep: Poor  Appetite:  Poor  Current Medications:  Current Facility-Administered Medications  Medication Dose Route Frequency Provider Last Rate Last Admin   acetaminophen (TYLENOL) tablet 650 mg  650 mg Oral Q6H PRN Sindy Guadeloupe, NP       alum & mag hydroxide-simeth (MAALOX/MYLANTA) 200-200-20 MG/5ML suspension 30 mL  30 mL Oral Q4H PRN Sindy Guadeloupe, NP       chlordiazePOXIDE  (LIBRIUM) capsule 25 mg  25 mg Oral Q6H PRN Lance Muss, MD       dicyclomine (BENTYL) tablet 20 mg  20 mg Oral Q6H PRN Sindy Guadeloupe, NP       hydrOXYzine (ATARAX) tablet 25 mg  25 mg Oral Q6H PRN Lance Muss, MD       loperamide (IMODIUM) capsule 2-4 mg  2-4 mg Oral PRN Sindy Guadeloupe, NP       OLANZapine zydis (ZYPREXA) disintegrating tablet 10 mg  10 mg Oral Q8H PRN Sindy Guadeloupe, NP       And   LORazepam (ATIVAN) tablet 1 mg  1 mg Oral PRN Sindy Guadeloupe, NP       And   ziprasidone (GEODON) injection 20 mg  20 mg Intramuscular PRN Sindy Guadeloupe, NP       magnesium hydroxide (MILK OF MAGNESIA) suspension 30 mL  30 mL Oral Daily PRN Sindy Guadeloupe, NP       methocarbamol (ROBAXIN) tablet 500 mg  500 mg Oral Q8H PRN Sindy Guadeloupe, NP       multivitamin with minerals tablet 1 tablet  1 tablet Oral Daily Sindy Guadeloupe, NP   1 tablet at 07/01/23 0954   naproxen (NAPROSYN) tablet 500 mg  500 mg Oral BID PRN Sindy Guadeloupe, NP       ondansetron (ZOFRAN-ODT) disintegrating tablet 4 mg  4 mg Oral Q6H PRN Sindy Guadeloupe, NP       thiamine (VITAMIN B1) tablet 100 mg  100 mg Oral Daily Sindy Guadeloupe, NP   100 mg at 07/01/23 0955   No current outpatient medications on file.    Labs  Lab Results:  Admission on 06/30/2023  Component Date Value Ref Range Status   WBC 06/30/2023 12.0 (H)  4.0 - 10.5 K/uL Final   RBC 06/30/2023 5.11  4.22 - 5.81 MIL/uL Final   Hemoglobin 06/30/2023 14.5  13.0 - 17.0 g/dL Final   HCT 16/04/9603 44.4  39.0 - 52.0 % Final   MCV 06/30/2023 86.9  80.0 - 100.0 fL Final   MCH 06/30/2023 28.4  26.0 - 34.0 pg Final   MCHC 06/30/2023 32.7  30.0 - 36.0 g/dL Final   RDW 54/03/8118 12.6  11.5 - 15.5 % Final   Platelets 06/30/2023 361  150 - 400 K/uL Final   nRBC 06/30/2023 0.0  0.0 - 0.2 % Final   Neutrophils Relative % 06/30/2023 71  % Final   Neutro Abs 06/30/2023 8.8 (H)  1.7 - 7.7 K/uL Final   Lymphocytes Relative 06/30/2023 19  % Final   Lymphs Abs 06/30/2023 2.2   0.7 - 4.0 K/uL Final   Monocytes Relative 06/30/2023 7  % Final   Monocytes Absolute 06/30/2023 0.8  0.1 - 1.0 K/uL Final   Eosinophils Relative 06/30/2023 1  %  Final   Eosinophils Absolute 06/30/2023 0.1  0.0 - 0.5 K/uL Final   Basophils Relative 06/30/2023 1  % Final   Basophils Absolute 06/30/2023 0.1  0.0 - 0.1 K/uL Final   Immature Granulocytes 06/30/2023 1  % Final   Abs Immature Granulocytes 06/30/2023 0.06  0.00 - 0.07 K/uL Final   Performed at [facility], [address] [place], [place] [address]   Sodium 06/30/2023 142  135 - 145 mmol/L Final   Potassium 06/30/2023 4.4  3.5 - 5.1 mmol/L Final   Chloride 06/30/2023 103  98 - 111 mmol/L Final   CO2 06/30/2023 28  22 - 32 mmol/L Final   Glucose, Bld 06/30/2023 102 (H)  70 - 99 mg/dL Final   Glucose reference range applies only to samples taken after fasting for at least 8 hours.   BUN 06/30/2023 14  6 - 20 mg/dL Final   Creatinine, Ser 06/30/2023 1.11  0.61 - 1.24 mg/dL Final   Calcium 60/45/4098 9.7  8.9 - 10.3 mg/dL Final   Total Protein 11/91/4782 7.1  6.5 - 8.1 g/dL Final   Albumin 95/62/1308 4.0  3.5 - 5.0 g/dL Final   AST 65/78/4696 18  15 - 41 U/L Final   ALT 06/30/2023 21  0 - 44 U/L Final   Alkaline Phosphatase 06/30/2023 65  38 - 126 U/L Final   Total Bilirubin 06/30/2023 0.5  <1.2 mg/dL Final   GFR, Estimated 06/30/2023 >60  >60 mL/min Final   Comment: (NOTE) Calculated using the CKD-EPI Creatinine Equation (2021)    Anion gap 06/30/2023 11  5 - 15 Final   Performed at [facility], [address], [place], [place] [address]   Alcohol, Ethyl (B) 06/30/2023 <10  <10 mg/dL Final   Comment: (NOTE) Lowest detectable limit for serum alcohol is 10 mg/dL.  For medical purposes only. Performed at [facility], [address] [place], [place] [address]    TSH 06/30/2023 1.074  0.350 - 4.500 uIU/mL Final   Comment: Performed by a 3rd Generation assay with a functional sensitivity of <=0.01  uIU/mL. Performed at [facility], [address] [place], [place] [address]    POC Amphetamine UR 06/30/2023 None Detected  NONE DETECTED (Cut Off Level 1000 ng/mL) Final   POC Secobarbital (BAR) 06/30/2023 None Detected  NONE DETECTED (Cut Off Level 300 ng/mL) Final   POC Buprenorphine (BUP) 06/30/2023 None Detected  NONE DETECTED (Cut Off Level 10 ng/mL) Final   POC Oxazepam (BZO) 06/30/2023 None Detected  NONE DETECTED (Cut Off Level 300 ng/mL) Final   POC Cocaine UR 06/30/2023 Positive (A)  NONE DETECTED (Cut Off Level 300 ng/mL) Final   POC Methamphetamine UR 06/30/2023 None Detected  NONE DETECTED (Cut Off Level 1000 ng/mL) Final   POC Morphine 06/30/2023 None Detected  NONE DETECTED (Cut Off Level 300 ng/mL) Final   POC Methadone UR 06/30/2023 None Detected  NONE DETECTED (Cut Off Level 300 ng/mL) Final   POC Oxycodone UR 06/30/2023 None Detected  NONE DETECTED (Cut Off Level 100 ng/mL) Final   POC Marijuana UR 06/30/2023 Positive (A)  NONE DETECTED (Cut Off Level 50 ng/mL) Final    Blood Alcohol level:  Lab Results  Component Value Date   ETH <10 06/30/2023   ETH <10 06/11/2021    Metabolic Disorder Labs: No results found for: "HGBA1C", "MPG" No results found for: "PROLACTIN" Lab Results  Component Value Date   CHOL 163 04/03/2010  TRIG 190 (H) 04/03/2010   HDL 54 04/03/2010   CHOLHDL 3.0 Ratio 04/03/2010   VLDL 38 04/03/2010   LDLCALC 71 04/03/2010   LDLCALC 44 01/03/2009    Therapeutic Lab Levels: No results found for: "LITHIUM" No results found for: "VALPROATE" No results found for: "CBMZ"  Physical Findings   PHQ2-9    Flowsheet Row ED from 06/30/2023 in [facility] Most recent reading at 07/01/2023  8:08 AM ED from 06/30/2023 in [facility] Most recent reading at 06/30/2023 10:47 PM  PHQ-2 Total Score 4 4  PHQ-9 Total Score 11 11      Flowsheet Row ED from 06/30/2023 in [facility] Most recent reading at 07/01/2023 12:13 AM ED from 06/30/2023 in [facility] Most recent reading at 06/30/2023  9:30 PM ED from 06/30/2023 in [facility] Most recent reading at 06/30/2023  1:42 PM  C-SSRS RISK CATEGORY No Risk No Risk No Risk        Musculoskeletal  Strength & Muscle Tone: within normal limits Gait & Station: normal Patient leans: N/A  Psychiatric Specialty Exam  Presentation  General Appearance:  Appropriate for Environment; Fairly Groomed  Eye Contact: Fair  Speech: Clear and Coherent; Normal Rate  Speech Volume: Increased  Handedness: Right   Mood and Affect  Mood: Depressed  Affect: Congruent; Restricted   Thought Process  Thought Processes: Coherent; Linear  Descriptions of Associations:Intact  Orientation:Full (Time, Place and Person)  Thought Content:Logical; WDL  Diagnosis of Schizophrenia or Schizoaffective disorder in past: No    Hallucinations:Hallucinations: None  Ideas of Reference:None  Suicidal Thoughts:Suicidal Thoughts: No  Homicidal Thoughts:Homicidal Thoughts: No   Sensorium  Memory: Remote Fair  Judgment: Impaired  Insight: Lacking   Executive Functions  Concentration: Good  Attention Span: Good  Recall: Good  Fund of Knowledge: Good  Language: Good   Psychomotor Activity  Psychomotor Activity: Psychomotor Activity: Normal   Assets  Assets: Communication Skills; Resilience   Sleep  Sleep: Sleep: Fair Number of Hours of Sleep: 6   Nutritional Assessment (For OBS and FBC admissions only) Has the patient had a weight loss or gain of 10 pounds or more in the last 3 months?: No Has the patient had a decrease in food intake/or appetite?: No Does the patient have dental problems?: No Does the patient have eating habits or behaviors that may be indicators of an eating disorder including binging or  inducing vomiting?: No Has the patient recently lost weight without trying?: 0 Has the patient been eating poorly because of a decreased appetite?: 0 Malnutrition Screening Tool Score: 0    Physical Exam  Physical Exam Vitals reviewed.  Constitutional:      Appearance: Normal appearance.  HENT:     Head: Normocephalic and atraumatic.  Cardiovascular:     Rate and Rhythm: Normal rate.  Pulmonary:     Effort: Pulmonary effort is normal.  Neurological:     General: No focal deficit present.     Mental Status: He is alert and oriented to person, place, and time.    Review of Systems  Constitutional:  Negative for chills and fever.  Respiratory:  Negative for shortness of breath.   Cardiovascular:  Negative for chest pain and palpitations.  Gastrointestinal:  Negative for nausea and vomiting.  Neurological:  Positive for dizziness and headaches.   Blood pressure 104/70, pulse 81, temperature 98 F (36.7 C), temperature source Oral,  resp. rate 18, SpO2 99%. There is no height or weight on file to calculate BMI.  Treatment Plan Summary: Jodie Rigley is a 37 year old male with a past psychiatric history of alcohol use disorder, opioid use disorder, cannabis use disorder, stimulant use, and tobacco use disorder who presents to [facility] for detox and rehabilitation services.  Based on patient's reported history, he does meet criteria for the diagnoses above; however, I feel patient is over presenting as his lab work does not reflect his high amount of substance use.  Regardless, we will treat with as needed medications to aid with possible withdrawal symptoms.  We will also start Remeron to aid with patient's sleep and appetite.  I discussed the risk, benefits, and possible side effects of the medication.  We will monitor possible effects during patient's stay.  Patient has multiple social economic factors such as unstable employment and negative influences in his environment that are contributing  to his continued substance use.  Alcohol Use Disorder -CIWA with Librium as needed for CIWA greater than 10 -Start Remeron 15 mg at bedtime for insomnia and poor appetite -Thiamine 100 mg IM first day and PO after that -Multivitamin with minerals daily -Tylenol 650 mg every 6 hours as needed for pain -Zofran 4 mg every 6 hours as needed for nausea or vomiting -Imodium 2 to 4 mg as needed for diarrhea or loose stools  -Maalox/Mylanta 30 mL every 4 hours as needed for indigestion -Milk of Mag 30 mL as needed for constipation   Opioid Use Disorder -COWS monitoring -Naproxen 500 mg BID as needed for pain -Bentyl 20 mg every 6 hours as needed for spasms/abdominal cramping -Robaxin 500 mg every 8 hours as needed for muscle spasms  Tobacco Use Disorder -NRT ordered -Encourage smoking cessation   Stimulant Use (cocaine type) -Encourage cessation  Disposition: Residential treatment  Lance Muss, MD 07/01/2023 10:06 AM

## 2023-07-01 NOTE — Group Note (Signed)
Group Topic: Social Support  Group Date: 07/01/2023 Start Time: 2000 End Time: 2030 Facilitators: Rae Lips B  Department: [facility]  Number of Participants: 3  Group Focus: check in Treatment Modality:  Individual Therapy Interventions utilized were support Purpose: express feelings  Name: Kevin Gentry Date of Birth: February 27, 1986  MR: [id number]    Level of Participation: minimal Quality of Participation: cooperative Interactions with others: gave feedback Mood/Affect: appropriate Triggers (if applicable): NA Cognition: coherent/clear Progress: None Response: NA Plan: patient will be encouraged to go to groups.  Patients Problems:  Patient Active Problem List   Diagnosis Date Noted   Alcohol abuse 06/30/2023   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

## 2023-07-02 DIAGNOSIS — F1910 Other psychoactive substance abuse, uncomplicated: Secondary | ICD-10-CM | POA: Diagnosis not present

## 2023-07-02 DIAGNOSIS — F17200 Nicotine dependence, unspecified, uncomplicated: Secondary | ICD-10-CM | POA: Diagnosis not present

## 2023-07-02 MED ORDER — NICOTINE 21 MG/24HR TD PT24: 21.0000 mg | MEDICATED_PATCH | Freq: Every day | TRANSDERMAL | 0 refills | Status: DC

## 2023-07-02 MED ORDER — MIRTAZAPINE 15 MG PO TABS: 15.0000 mg | ORAL_TABLET | Freq: Every day | ORAL | 1 refills | Status: DC

## 2023-07-02 NOTE — ED Notes (Signed)
Patient is sleeping. Respirations equal and unlabored, skin warm and dry. No change in assessment or acuity. Routine safety checks conducted according to facility protocol. Will continue to monitor for safety.   

## 2023-07-02 NOTE — ED Notes (Signed)
Patient A&Ox4. Denies intent to harm self/others when asked. Denies A/VH. Patient denies any physical complaints when asked. No acute distress noted. Routine safety checks conducted according to facility protocol. Encouraged patient to notify staff if thoughts of harm toward self or others arise. Patient verbalize understanding and agreement. Will continue to monitor for safety.    

## 2023-07-02 NOTE — Group Note (Signed)
Group Topic: Recovery Basics  Group Date: 07/02/2023 Start Time: 1000 End Time: 1030 Facilitators: Londell Moh, NT  Department: [facility]  Number of Participants: 2  Group Focus: coping skills and daily focus Treatment Modality:  Psychoeducation Interventions utilized were group exercise and patient education Purpose: enhance coping skills, express feelings, and increase insight  Name: Kevin Gentry Date of Birth: 05-30-1986  MR: [id number]    Level of Participation: moderate Quality of Participation: attentive Interactions with others: gave feedback Mood/Affect: appropriate Triggers (if applicable): n/a Cognition: coherent/clear Progress: Moderate Response: n/a Plan: follow-up needed  Patients Problems:  Patient Active Problem List   Diagnosis Date Noted   Alcohol abuse 06/30/2023   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

## 2023-07-02 NOTE — Discharge Planning (Signed)
LCSW followed up with [facility] Admissions Coordinator [place] who reports patient would be a good candidate for their treatment facility. Per [place], transportation can be arranged via  [facility] for the patient to arrive on tomorrow 07/03/2023 via [employer] at 8:05am. Patient will need to call into the facility in the morning to inform them of the clothing he will have on so they will know when he arrives. LCSW spoke with patient to confirm if he is in agreement with plan. Patient is agreeable to plan and expressed appreciation for LCSW assistance. Patient reports he will follow up to see if family will bring him more clothing. No other needs were reported by patient or facility. 30 day script needed and medications can be filled at their facility.   Fernande Boyden, LCSW Clinical Social Worker [facility] Ph: [phone]

## 2023-07-02 NOTE — ED Notes (Signed)
 Pt was provided lunch

## 2023-07-02 NOTE — ED Notes (Signed)
Pt was provided dinner.

## 2023-07-02 NOTE — ED Notes (Signed)
Pt on phone completing a interview screening per SW request. Pt calm, cooperative and pleasant. Denies needs or concerns at present. Will continue to monitor for safety.

## 2023-07-02 NOTE — Group Note (Signed)
Group Topic: Communication  Group Date: 07/02/2023 Start Time: 2000 End Time: 2030 Facilitators: Rae Lips B  Department: [facility]  Number of Participants: 2  Group Focus: check in Treatment Modality:  Individual Therapy Interventions utilized were support Purpose: express feelings  Name: Kevin Gentry Date of Birth: 06-Aug-1985  MR: [id number]    Level of Participation: active Quality of Participation: attentive and cooperative Interactions with others: gave feedback Mood/Affect: appropriate Triggers (if applicable): na Cognition: coherent/clear Progress: Gaining insight Response: Said's he's had a good day he enjoys reading to occupied his time. Plan: patient will be encouraged to keep going to groups.  Patients Problems:  Patient Active Problem List   Diagnosis Date Noted   Alcohol abuse 06/30/2023   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

## 2023-07-02 NOTE — ED Notes (Addendum)
Pt eating lunch. No acute distress noted. Safety maintained. 

## 2023-07-02 NOTE — ED Notes (Signed)
Pt was provided breakfast.

## 2023-07-02 NOTE — ED Notes (Signed)
Pt sitting in dayroom eating dinner. No acute distress noted. No concerns voiced. Informed pt to notify staff with any needs or assistance. Pt verbalized understanding or agreement. Will continue to monitor for safety.

## 2023-07-02 NOTE — Discharge Instructions (Addendum)
Patient will be discharging to [facility] on tomorrow 07/03/2023 with transportation provided via [employer] from [facility] at 8:05am. [facility] will provide transport to [facility]. Address is [address] [place], [place] [address]. Staff will pick the patient up once he arrives that the station. Number for facility: [phone]  [facility] [address], [place], [address] [phone] phone  New Patient Assessment/Therapy Walk-Ins:  Monday and Wednesday: 8 am until slots are full. Every 1st and 2nd Fridays of the month: 1 pm - 5 pm.  NO ASSESSMENT/THERAPY WALK-INS ON TUESDAYS OR THURSDAYS  New Patient Assessment/Medication Management Walk-Ins:  Monday - Friday:  8 am - 11 am.  For all walk-ins, we ask that you arrive by 7:30 am because patients will be seen in the order of arrival.  Availability is limited; therefore, you may not be seen on the same day that you walk-in.  Our goal is to serve and meet the needs of our community to the best of our ability.  SUBSTANCE USE TREATMENT for Medicaid and State Funded/IPRS  [facility] [address], [place], [address] [phone] phone NOTE: [facility] is no longer offering IOP services.  Serves those who are low-income or have no insurance.  [facility] [address], [place], [place], [address] [phone] phone [phone] fax NOTE: Does have Substance Abuse-Intensive Outpatient Program [facility]) as well as transitional housing if eligible.  [facility] [address] [place], [place], [address] [phone] phone [phone] fax  [facility] [phone] [place], [place], [address] [phone] phone [phone] fax  HALFWAY HOUSES:  [facility] [phone]  [employer].oxfordvacancies.com  12 STEP PROGRAMS:  Alcoholics Anonymous of [place]  Narcotics  Anonymous of [place] [employer]  Al-Anon of [place], [place] www.[place]alanon.org/find-meetings.html  Nar-Anon https://nar-anon.org/find-a-meetin  List of Residential placements:   [facility] in [place]: [phone]  [facility]: [phone]  [place], [place] [phone]: Male and male facility; 30-day program: (uninsured and Medicaid such as Laurena Bering, [place], [place], partners)  [facility]: [phone]; men and women's facility; 28 days; Can have Medicaid tailored plan Tour manager or Partners)  [facility]: [phone] Karoline Caldwell or Larita Fife; 28 day program; must be fully detox; tailored Medicaid or no insurance  [address] in [place], [place]; [phone]; 28 day all males program; no insurance accepted  [facility] Referral in [place]: Gabriel Rung [phone] (no insurance or Medicaid only); 90 days; outpatient services but provide housing in apartments [place]  [facility] Admission: [phone]: Patient must complete phone screening for placement: [place], [place]; 6 month program; uninsured, Medicaid, and [employer].   [facility]: no insurance required; [phone]  [facility]: [phone]; Intake: Molly Maduro; Must fill out application online; Alecia Lemming Delay [phone] x [address] Mission in [place], [place]: [phone]; Admissions Coordinators Mr. Maurine Minister or Barron Alvine; 90 day program.  [facility]: [place], [place] [phone]; Co-Ed 9 month to a year program; Online application; Men entry fee is $500 (6-82months);  [employer]: [address] [place], [place] [address]; no fee or insurance required; minimum of 2 years; Highly structured; work based; Intake Coordinator is Thayer Ohm [phone]  [facility] in [place], [place]: [phone]; Fax number is [phone]; website: www.[facility].org;  Requires 3-6 page autobiography; 2 year program (18 months and then 74month transitional housing); Admission fee is $300; no insurance needed; work Automotive engineer in [place], [place]: Front Desk Staff: Danise Edge [phone]: They have a Men's Regenerations Program 6-27months. Free program; There is an initial $300 fee however, they are willing to work with patients regarding that. Application is online.  [facility]: Admissions [phone] Doran Heater ext [phone]; Any 7-90 day program is out of pocket; 12 month program is free of charge; there is a $275 entry fee; Patient is responsible for own transportation

## 2023-07-02 NOTE — ED Provider Notes (Signed)
[facility] ASAP Discharge Summary  Date and Time: 07/02/2023 3:02 PM  Name: Kevin Gentry  MRN:  [id number]   Discharge Diagnoses:  Final diagnoses:  Tobacco use disorder  Cannabis use disorder  Opioid use disorder  Alcohol use disorder    Subjective: Kevin Gentry is a 37 year old male with a past psychiatric history of alcohol use disorder, opioid use disorder, cannabis use disorder, and tobacco use disorder who presents to [facility] for detox and rehabilitation services.   Patient was [facility] for [facility], pick up at bus station at 8:30 AM on 12/21.  Stay Summary: The patient was evaluated each day by a clinical provider to ascertain response to treatment. Improvement was noted by the patient's report of decreasing symptoms, improved sleep and appetite, affect, medication tolerance, behavior, and participation in unit programming.  Patient was asked each day to complete a self inventory noting mood, mental status, pain, new symptoms, anxiety and concerns.  The patient's medications were managed with the following directions: Alcohol Use Disorder -Started and Continue Remeron 15 mg at bedtime for insomnia and poor appetite -Thiamine 100 mg IM first day and PO after that -Multivitamin with minerals daily -Tylenol 650 mg every 6 hours as needed for pain -Zofran 4 mg every 6 hours as needed for nausea or vomiting -Imodium 2 to 4 mg as needed for diarrhea or loose stools  -Maalox/Mylanta 30 mL every 4 hours as needed for indigestion -Milk of Mag 30 mL as needed for constipation    Opioid Use Disorder -COWS monitoring -Naproxen 500 mg BID as needed for pain -Bentyl 20 mg every 6 hours as needed for spasms/abdominal cramping -Robaxin 500 mg every 8 hours as needed for muscle spasms   Tobacco Use Disorder -NRT ordered  Patient responded well to medication and being in a therapeutic and supportive environment. Positive and appropriate behavior was noted and the patient was motivated for recovery. The  patient worked closely with the treatment team and case manager to develop a discharge plan with appropriate goals. Coping skills, problem solving as well as relaxation therapies were also part of the unit programming.   Total Time spent with patient: 30 minutes  Past Psychiatric History: Unsure, states he previously was on Wellbutrin, Seroquel, Adderall, and Depakote and stop around 2020.  Previously seeing a psychiatrist and stopped around 2020 Past Medical History: Denies Family History: Denies Family Psychiatric  History: Denies Social History: Lives alone in an apartment, works in a [employer] which he goes to [employer], does catering, and moving furniture Tobacco Cessation:  A prescription for an FDA-approved tobacco cessation medication provided at discharge  Current Medications:  Current Facility-Administered Medications  Medication Dose Route Frequency Provider Last Rate Last Admin   acetaminophen (TYLENOL) tablet 650 mg  650 mg Oral Q6H PRN Sindy Guadeloupe, NP       alum & mag hydroxide-simeth (MAALOX/MYLANTA) 200-200-20 MG/5ML suspension 30 mL  30 mL Oral Q4H PRN Sindy Guadeloupe, NP       chlordiazePOXIDE (LIBRIUM) capsule 25 mg  25 mg Oral Q6H PRN Lance Muss, MD       dicyclomine (BENTYL) tablet 20 mg  20 mg Oral Q6H PRN Sindy Guadeloupe, NP       hydrOXYzine (ATARAX) tablet 25 mg  25 mg Oral Q6H PRN Kizzie Ide B, MD       loperamide (IMODIUM) capsule 2-4 mg  2-4 mg Oral PRN Sindy Guadeloupe, NP       OLANZapine zydis (ZYPREXA) disintegrating tablet 10 mg  10 mg Oral Q8H PRN Sindy Guadeloupe, NP       And   LORazepam (ATIVAN) tablet 1 mg  1 mg Oral PRN Sindy Guadeloupe, NP       And   ziprasidone (GEODON) injection 20 mg  20 mg Intramuscular PRN Sindy Guadeloupe, NP       magnesium hydroxide (MILK OF MAGNESIA) suspension 30 mL  30 mL Oral Daily PRN Sindy Guadeloupe, NP       methocarbamol (ROBAXIN) tablet 500 mg  500 mg Oral Q8H PRN Sindy Guadeloupe, NP       mirtazapine (REMERON) tablet  15 mg  15 mg Oral QHS Kizzie Ide B, MD   15 mg at 07/01/23 2103   multivitamin with minerals tablet 1 tablet  1 tablet Oral Daily Sindy Guadeloupe, NP   1 tablet at 07/02/23 1035   naproxen (NAPROSYN) tablet 500 mg  500 mg Oral BID PRN Sindy Guadeloupe, NP       nicotine (NICODERM CQ - dosed in mg/24 hours) patch 21 mg  21 mg Transdermal Daily Kizzie Ide B, MD   21 mg at 07/02/23 1035   ondansetron (ZOFRAN-ODT) disintegrating tablet 4 mg  4 mg Oral Q6H PRN Sindy Guadeloupe, NP       thiamine (VITAMIN B1) tablet 100 mg  100 mg Oral Daily Sindy Guadeloupe, NP   100 mg at 07/02/23 1035   No current outpatient medications on file.    PTA Medications:  Facility Ordered Medications  Medication   acetaminophen (TYLENOL) tablet 650 mg   alum & mag hydroxide-simeth (MAALOX/MYLANTA) 200-200-20 MG/5ML suspension 30 mL   magnesium hydroxide (MILK OF MAGNESIA) suspension 30 mL   dicyclomine (BENTYL) tablet 20 mg   loperamide (IMODIUM) capsule 2-4 mg   methocarbamol (ROBAXIN) tablet 500 mg   naproxen (NAPROSYN) tablet 500 mg   ondansetron (ZOFRAN-ODT) disintegrating tablet 4 mg   [COMPLETED] thiamine (VITAMIN B1) injection 100 mg   thiamine (VITAMIN B1) tablet 100 mg   multivitamin with minerals tablet 1 tablet   OLANZapine zydis (ZYPREXA) disintegrating tablet 10 mg   And   LORazepam (ATIVAN) tablet 1 mg   And   ziprasidone (GEODON) injection 20 mg   chlordiazePOXIDE (LIBRIUM) capsule 25 mg   hydrOXYzine (ATARAX) tablet 25 mg   nicotine (NICODERM CQ - dosed in mg/24 hours) patch 21 mg   mirtazapine (REMERON) tablet 15 mg       07/01/2023    8:08 AM 06/30/2023   10:47 PM  Depression screen PHQ 2/9  Decreased Interest 3 3  Down, Depressed, Hopeless 1 1  PHQ - 2 Score 4 4  Altered sleeping 1 1  Tired, decreased energy 0 0  Change in appetite 1 1  Feeling bad or failure about yourself  3 3  Trouble concentrating 0 0  Moving slowly or fidgety/restless 2 2  Suicidal thoughts 0 0  PHQ-9  Score 11 11    Flowsheet Row ED from 06/30/2023 in [facility] Most recent reading at 07/01/2023 12:13 AM ED from 06/30/2023 in [facility] Most recent reading at 06/30/2023  9:30 PM ED from 06/30/2023 in [facility] Most recent reading at 06/30/2023  1:42 PM  C-SSRS RISK CATEGORY No Risk No Risk No Risk       Musculoskeletal  Strength & Muscle Tone: within normal limits Gait & Station: normal Patient leans: N/A  Psychiatric Specialty Exam  Presentation  General Appearance:  Appropriate for  Environment; Fairly Groomed  Eye Contact: Fair  Speech: Clear and Coherent; Normal Rate  Speech Volume: Normal  Handedness: Right   Mood and Affect  Mood: Euthymic  Affect: Congruent; Appropriate   Thought Process  Thought Processes: Coherent  Descriptions of Associations:Intact  Orientation:Full (Time, Place and Person)  Thought Content:Logical  Diagnosis of Schizophrenia or Schizoaffective disorder in past: No    Hallucinations:Hallucinations: None  Ideas of Reference:None  Suicidal Thoughts:Suicidal Thoughts: No  Homicidal Thoughts:Homicidal Thoughts: No   Sensorium  Memory: Remote Good  Judgment: Fair  Insight: Fair   Art therapist  Concentration: Good  Attention Span: Good  Recall: Good  Fund of Knowledge: Good  Language: Good   Psychomotor Activity  Psychomotor Activity: Psychomotor Activity: Normal   Assets  Assets: Communication Skills; Resilience; Social Support   Sleep  Sleep: Sleep: Good    Physical Exam  Physical Exam Vitals reviewed.  Constitutional:      Appearance: Normal appearance.  HENT:     Head: Normocephalic and atraumatic.  Cardiovascular:     Rate and Rhythm: Normal rate.  Pulmonary:     Effort: Pulmonary effort is normal.  Neurological:     General: No focal deficit present.     Mental Status: He  is alert and oriented to person, place, and time.      Review of Systems  Constitutional:  Negative for chills and fever.  Respiratory:  Negative for shortness of breath.   Cardiovascular:  Negative for chest pain and palpitations.  Gastrointestinal:  Negative for nausea and vomiting.  Neurological:  Positive for dizziness and headaches.  Blood pressure 97/69, pulse 79, temperature 98.3 F (36.8 C), temperature source Oral, resp. rate 16, SpO2 100%. There is no height or weight on file to calculate BMI.  Demographic Factors:  Low socioeconomic status  Loss Factors: Financial problems/change in socioeconomic status  Historical Factors: Impulsivity  Risk Reduction Factors:   Positive coping skills or problem solving skills  Continued Clinical Symptoms:  Alcohol/Substance Abuse/Dependencies  Cognitive Features That Contribute To Risk:  Closed-mindedness    Suicide Risk:  Mild:  Suicidal ideation of limited frequency, intensity, duration, and specificity.  There are no identifiable plans, no associated intent, mild dysphoria and related symptoms, good self-control (both objective and subjective assessment), few other risk factors, and identifiable protective factors, including available and accessible social support.  Plan Of Care/Follow-up recommendations:  Activity as tolerated Regular diet Continue prescription medications See PCP for medical conditions   Disposition: [place] treatment center on 12/21  Lance Muss, MD 07/02/2023, 3:02 PM

## 2023-07-02 NOTE — ED Notes (Signed)
Patient is in the bedroom calm and sleeping. NAD. Respirations are even and unlabored. Will continue to monitor for safety.

## 2023-07-02 NOTE — ED Notes (Addendum)
Pt goal oriented. Pt states, "I have to get myself together. I have 3 daughters that are counting on me and I'm tired of letting them down. I wouldn't want no man doing this to my girls, so I have to lead by example first. Then they will know their worth". Praise and encouragement provided. Will continue to monitor for safety.

## 2023-07-02 NOTE — Tx Team (Addendum)
LCSW met with patient to assess current mood, affect, physical state, and inquire about needs/goals while here in [facility] and after discharge. Patient reports he presented due to needing help with his substance use. Patient reports he has been struggling with alcohol, pills, and marijuana use for many years. Patient reports he has never sought treatment to assist with working towards sobriety. Patient reports he is hoping to be considered for a 28-30 day residential program in order to help aid with getting him back on the right track. Patient denies any legal charges or upcoming court dates. Patient reports he lives alone in an apartment here in [place] that he does not plan to return back to. Patient reports he is on disability and reports having Medicare Part A and B. Patient aware that his options for residential placement at this time may be limited, as most facilities do not accept that insurance. Patient expressed understanding and stated he would like to consider the closest facility to home. Patient reports having support from his father Casimiro Needle [phone]. Patient reports he believes his family would want him to get further assistance so he is here to try to make a change in his life. Patient aware that LCSW will send referrals out for review and will follow up to provide updates as received. Patient expressed understanding and appreciation of LCSW assistance. No other needs were reported at this time by patient.   Referral has been sent to [facility], [employer] and [facility] for review.  LCSW will continue to follow and provide support to patient while on [facility] unit.   Fernande Boyden, LCSW Clinical Social Worker [place] [facility] Ph: [phone]

## 2023-07-02 NOTE — ED Notes (Addendum)
Pt in the dayroom watching TV. Calm and composed. Pt aware he leaves early morning to [employer] center. NAD. Respirations even and unlabored. Will keep monitoring for safety.

## 2023-07-02 NOTE — ED Provider Notes (Addendum)
Behavioral Health Progress Note  Date and Time: 07/02/2023 9:14 AM Name: Kevin Gentry MRN:  [id number]  Reason for admission: Detox from alcohol and opiates  Subjective: Kevin Gentry is a 37 year old male with a past psychiatric history of alcohol use disorder, opioid use disorder, cannabis use disorder, and tobacco use disorder who presents to [facility] for detox and rehabilitation services.  Patient seen in the milieu, no acute distress. Patient reports feeling "okay" today. Patient reports good sleep and good appetite. Regarding withdrawal symptoms, he reports some headache and dizziness. Regarding cravings, he reports this is present and his motivating factors include his children and his father. Patient denies current SI, HI, and AVH.  He denies any adverse effects from starting Remeron last night.  He states he does not notice much difference.  Regarding discharge plans, he continues to prefer going to residential rehabilitation after detoxing.    Diagnosis:  Final diagnoses:  Tobacco use disorder  Cannabis use disorder  Opioid use disorder  Alcohol use disorder    Total Time spent with patient: 30 minutes  Past Psychiatric History: Unsure, states he previously was on Wellbutrin, Seroquel, Adderall, and Depakote and stop around 2020.  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  Pt will drink liquor, beer and wine.  "as much as my body can stand" 1 - Age of First Use: 37 years of age 43 - Amount (size/oz): Liquor wil drink up to a 5th.  Beer will drink four 40's.  Wine will drink 40oz of fortified wine 1 - Frequency: Daily, in varying amounts 1 - Duration:  ongoing 1 - Last Use / Amount: earlier today (12/18) 1 - Method of Aquiring: Purchase 1- Route of Use: oral Name of Substance 2: Marijuana 2 - Age of First Use: 37 years of age 3 - Amount (size/oz): 3 grams in three days 2 - Frequency: Daily use. 2 - Duration: ongoing 2 - Last Use / Amount: Today (12/18) 2 - Method of Aquiring: illegal purchase 2 - Route of Substance Use: inhalation Name of Substance 3: Pain killers and ecstasy (pt could not specifically identify anything but oxycodone) 3 - Age of First Use: 37 years of age 53 - Amount (size/oz): Varies "whatever I get from others" 3 - Frequency: Varies according to what other people give him.  Patient vague. 3 - Duration: off and on 3 - Last Use / Amount: Pt couldn't recall 3 - Method of Aquiring: illegal purchase 3 - Route of Substance Use: oral              Sleep: Poor  Appetite:  Poor  Current Medications:  Current Facility-Administered Medications  Medication Dose Route Frequency Provider Last Rate Last Admin   acetaminophen (TYLENOL) tablet 650 mg  650 mg Oral Q6H PRN Sindy Guadeloupe, NP       alum & mag hydroxide-simeth (MAALOX/MYLANTA) 200-200-20 MG/5ML suspension 30 mL  30 mL Oral Q4H PRN Sindy Guadeloupe, NP       chlordiazePOXIDE (LIBRIUM) capsule 25 mg  25 mg Oral Q6H PRN Kizzie Ide B, MD       dicyclomine (BENTYL) tablet 20 mg  20 mg Oral Q6H PRN Sindy Guadeloupe,  NP       hydrOXYzine (ATARAX) tablet 25 mg  25 mg Oral Q6H PRN Kizzie Ide B, MD       loperamide (IMODIUM) capsule 2-4 mg  2-4 mg Oral PRN Sindy Guadeloupe, NP       OLANZapine zydis (ZYPREXA) disintegrating tablet 10 mg  10 mg Oral Q8H PRN Sindy Guadeloupe, NP       And   LORazepam (ATIVAN) tablet 1 mg  1 mg Oral PRN Sindy Guadeloupe, NP       And   ziprasidone (GEODON) injection 20 mg  20 mg Intramuscular PRN Sindy Guadeloupe, NP       magnesium hydroxide (MILK OF MAGNESIA) suspension 30 mL  30 mL Oral Daily PRN Sindy Guadeloupe, NP       methocarbamol (ROBAXIN)  tablet 500 mg  500 mg Oral Q8H PRN Sindy Guadeloupe, NP       mirtazapine (REMERON) tablet 15 mg  15 mg Oral QHS Kizzie Ide B, MD   15 mg at 07/01/23 2103   multivitamin with minerals tablet 1 tablet  1 tablet Oral Daily Sindy Guadeloupe, NP   1 tablet at 07/01/23 0954   naproxen (NAPROSYN) tablet 500 mg  500 mg Oral BID PRN Sindy Guadeloupe, NP       nicotine (NICODERM CQ - dosed in mg/24 hours) patch 21 mg  21 mg Transdermal Daily Kizzie Ide B, MD   21 mg at 07/01/23 1132   ondansetron (ZOFRAN-ODT) disintegrating tablet 4 mg  4 mg Oral Q6H PRN Sindy Guadeloupe, NP       thiamine (VITAMIN B1) tablet 100 mg  100 mg Oral Daily Sindy Guadeloupe, NP   100 mg at 07/01/23 0955   No current outpatient medications on file.    Labs  Lab Results:  Admission on 06/30/2023  Component Date Value Ref Range Status   WBC 06/30/2023 12.0 (H)  4.0 - 10.5 K/uL Final   RBC 06/30/2023 5.11  4.22 - 5.81 MIL/uL Final   Hemoglobin 06/30/2023 14.5  13.0 - 17.0 g/dL Final   HCT 81/19/1478 44.4  39.0 - 52.0 % Final   MCV 06/30/2023 86.9  80.0 - 100.0 fL Final   MCH 06/30/2023 28.4  26.0 - 34.0 pg Final   MCHC 06/30/2023 32.7  30.0 - 36.0 g/dL Final   RDW 29/56/2130 12.6  11.5 - 15.5 % Final   Platelets 06/30/2023 361  150 - 400 K/uL Final   nRBC 06/30/2023 0.0  0.0 - 0.2 % Final   Neutrophils Relative % 06/30/2023 71  % Final   Neutro Abs 06/30/2023 8.8 (H)  1.7 - 7.7 K/uL Final   Lymphocytes Relative 06/30/2023 19  % Final   Lymphs Abs 06/30/2023 2.2  0.7 - 4.0 K/uL Final   Monocytes Relative 06/30/2023 7  % Final   Monocytes Absolute 06/30/2023 0.8  0.1 - 1.0 K/uL Final   Eosinophils Relative 06/30/2023 1  % Final   Eosinophils Absolute 06/30/2023 0.1  0.0 - 0.5 K/uL Final   Basophils Relative 06/30/2023 1  % Final   Basophils Absolute 06/30/2023 0.1  0.0 - 0.1 K/uL Final   Immature Granulocytes 06/30/2023 1  % Final   Abs Immature Granulocytes 06/30/2023 0.06  0.00 - 0.07 K/uL Final   Performed at [facility], [address], [place], [place] [address]   Sodium 06/30/2023 142  135 - 145 mmol/L Final   Potassium 06/30/2023 4.4  3.5 - 5.1 mmol/L Final  Chloride 06/30/2023 103  98 - 111 mmol/L Final   CO2 06/30/2023 28  22 - 32 mmol/L Final   Glucose, Bld 06/30/2023 102 (H)  70 - 99 mg/dL Final   Glucose reference range applies only to samples taken after fasting for at least 8 hours.   BUN 06/30/2023 14  6 - 20 mg/dL Final   Creatinine, Ser 06/30/2023 1.11  0.61 - 1.24 mg/dL Final   Calcium 56/21/3086 9.7  8.9 - 10.3 mg/dL Final   Total Protein 57/84/6962 7.1  6.5 - 8.1 g/dL Final   Albumin 95/28/4132 4.0  3.5 - 5.0 g/dL Final   AST 44/07/270 18  15 - 41 U/L Final   ALT 06/30/2023 21  0 - 44 U/L Final   Alkaline Phosphatase 06/30/2023 65  38 - 126 U/L Final   Total Bilirubin 06/30/2023 0.5  <1.2 mg/dL Final   GFR, Estimated 06/30/2023 >60  >60 mL/min Final   Comment: (NOTE) Calculated using the CKD-EPI Creatinine Equation (2021)    Anion gap 06/30/2023 11  5 - 15 Final   Performed at [facility], [address] [place], [place] [address]   Alcohol, Ethyl (B) 06/30/2023 <10  <10 mg/dL Final   Comment: (NOTE) Lowest detectable limit for serum alcohol is 10 mg/dL.  For medical purposes only. Performed at [facility], [address] [place], [place] [address]    TSH 06/30/2023 1.074  0.350 - 4.500 uIU/mL Final   Comment: Performed by a 3rd Generation assay with a functional sensitivity of <=0.01 uIU/mL. Performed at [facility], [address] [place], [place] [address]    POC Amphetamine UR 06/30/2023 None Detected  NONE DETECTED (Cut Off Level 1000 ng/mL) Final   POC Secobarbital (BAR) 06/30/2023 None Detected  NONE DETECTED (Cut Off Level 300 ng/mL) Final   POC Buprenorphine (BUP) 06/30/2023 None Detected  NONE DETECTED (Cut Off Level 10 ng/mL) Final   POC Oxazepam (BZO) 06/30/2023 None Detected  NONE DETECTED (Cut Off Level 300 ng/mL) Final   POC  Cocaine UR 06/30/2023 Positive (A)  NONE DETECTED (Cut Off Level 300 ng/mL) Final   POC Methamphetamine UR 06/30/2023 None Detected  NONE DETECTED (Cut Off Level 1000 ng/mL) Final   POC Morphine 06/30/2023 None Detected  NONE DETECTED (Cut Off Level 300 ng/mL) Final   POC Methadone UR 06/30/2023 None Detected  NONE DETECTED (Cut Off Level 300 ng/mL) Final   POC Oxycodone UR 06/30/2023 None Detected  NONE DETECTED (Cut Off Level 100 ng/mL) Final   POC Marijuana UR 06/30/2023 Positive (A)  NONE DETECTED (Cut Off Level 50 ng/mL) Final    Blood Alcohol level:  Lab Results  Component Value Date   ETH <10 06/30/2023   ETH <10 06/11/2021    Metabolic Disorder Labs: No results found for: "HGBA1C", "MPG" No results found for: "PROLACTIN" Lab Results  Component Value Date   CHOL 163 04/03/2010   TRIG 190 (H) 04/03/2010   HDL 54 04/03/2010   CHOLHDL 3.0 Ratio 04/03/2010   VLDL 38 04/03/2010   LDLCALC 71 04/03/2010   LDLCALC 44 01/03/2009    Therapeutic Lab Levels: No results found for: "LITHIUM" No results found for: "VALPROATE" No results found for: "CBMZ"  Physical Findings   PHQ2-9    Flowsheet Row ED from 06/30/2023 in [facility] Most recent reading at 07/01/2023  8:08 AM ED from 06/30/2023 in [facility] Most recent reading at 06/30/2023 10:47  PM  PHQ-2 Total Score 4 4  PHQ-9 Total Score 11 11      Flowsheet Row ED from 06/30/2023 in [facility] Most recent reading at 07/01/2023 12:13 AM ED from 06/30/2023 in [facility] Most recent reading at 06/30/2023  9:30 PM ED from 06/30/2023 in [facility] Most recent reading at 06/30/2023  1:42 PM  C-SSRS RISK CATEGORY No Risk No Risk No Risk        Musculoskeletal  Strength & Muscle Tone: within normal limits Gait & Station: normal Patient leans: N/A  Psychiatric Specialty Exam   Presentation  General Appearance:  Appropriate for Environment; Fairly Groomed  Eye Contact: Fair  Speech: Clear and Coherent; Normal Rate  Speech Volume: Normal  Handedness: Right   Mood and Affect  Mood: Euthymic  Affect: Congruent; Appropriate   Thought Process  Thought Processes: Coherent  Descriptions of Associations:Intact  Orientation:Full (Time, Place and Person)  Thought Content:Logical  Diagnosis of Schizophrenia or Schizoaffective disorder in past: No    Hallucinations:Hallucinations: None  Ideas of Reference:None  Suicidal Thoughts:Suicidal Thoughts: No  Homicidal Thoughts:Homicidal Thoughts: No   Sensorium  Memory: Remote Good  Judgment: Fair  Insight: Fair   Art therapist  Concentration: Good  Attention Span: Good  Recall: Good  Fund of Knowledge: Good  Language: Good   Psychomotor Activity  Psychomotor Activity: Psychomotor Activity: Normal   Assets  Assets: Communication Skills; Resilience; Social Support   Sleep  Sleep: Sleep: Good    Physical Exam  Physical Exam Vitals reviewed.  Constitutional:      Appearance: Normal appearance.  HENT:     Head: Normocephalic and atraumatic.  Cardiovascular:     Rate and Rhythm: Normal rate.  Pulmonary:     Effort: Pulmonary effort is normal.  Neurological:     General: No focal deficit present.     Mental Status: He is alert and oriented to person, place, and time.    Review of Systems  Constitutional:  Negative for chills and fever.  Respiratory:  Negative for shortness of breath.   Cardiovascular:  Negative for chest pain and palpitations.  Gastrointestinal:  Negative for nausea and vomiting.  Neurological:  Positive for dizziness and headaches.   Blood pressure 97/69, pulse 79, temperature 98.3 F (36.8 C), temperature source Oral, resp. rate 16, SpO2 100%. There is no height or weight on file to calculate BMI.  Treatment Plan  Summary: Kevin Gentry is a 36 year old male with a past psychiatric history of alcohol use disorder, opioid use disorder, cannabis use disorder, stimulant use, and tobacco use disorder who presents to [facility] for detox and rehabilitation services.  Alcohol Use Disorder -CIWA with Librium as needed for CIWA greater than 10 -Continue Remeron 15 mg at bedtime for insomnia and poor appetite -Thiamine 100 mg IM first day and PO after that -Multivitamin with minerals daily -Tylenol 650 mg every 6 hours as needed for pain -Zofran 4 mg every 6 hours as needed for nausea or vomiting -Imodium 2 to 4 mg as needed for diarrhea or loose stools  -Maalox/Mylanta 30 mL every 4 hours as needed for indigestion -Milk of Mag 30 mL as needed for constipation   Opioid Use Disorder -COWS monitoring -Naproxen 500 mg BID as needed for pain -Bentyl 20 mg every 6 hours as needed for spasms/abdominal cramping -Robaxin 500 mg every 8 hours as needed for muscle spasms  Tobacco Use Disorder -NRT ordered -Encourage smoking cessation  Stimulant Use (cocaine type) -Encourage cessation  Disposition: Residential treatment  Lance Muss, MD 07/02/2023 9:15 AM

## 2023-07-02 NOTE — Group Note (Signed)
Group Topic: Decisional Balance/Substance Abuse  Group Date: 07/02/2023 Start Time: 1215 End Time: 1250 Facilitators: Prentice Docker, RN  Department: [facility]  Number of Participants: 1  Group Focus: individual meeting Treatment Modality:  Individual Therapy Interventions utilized were leisure development Purpose: express feelings  Name: Kevin Gentry Date of Birth: 06/02/1986  MR: [id number]    Level of Participation: active Quality of Participation: motivated Interactions with others: gave feedback Mood/Affect: appropriate Triggers (if applicable): none identified Cognition: coherent/clear and insightful Progress: Gaining insight Response: "I have to get myself together. I have 3 daughters that are counting on me and I'm tired of letting them down. I wouldn't want no man doing this to my girls, so I have to lead by example first. Then they will know their worth". Plan: patient will be encouraged to stay focused on his goals and complete detox and residential program.  Patients Problems:  Patient Active Problem List   Diagnosis Date Noted   Alcohol abuse 06/30/2023   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

## 2023-07-03 DIAGNOSIS — F17200 Nicotine dependence, unspecified, uncomplicated: Secondary | ICD-10-CM | POA: Diagnosis not present

## 2023-07-03 NOTE — ED Notes (Signed)
A 3rd call was made at this time again and was told taxis on the way to pick patient.

## 2023-07-03 NOTE — ED Notes (Signed)
A call was made to to [employer] requesting a ride for patient to be disposed to the [employer] to be taken to [facility].

## 2023-07-03 NOTE — ED Notes (Signed)
Taxi here to pick patient. Belongings given and discharge paper work given as well. Taxi Paediatric nurse.Pt's assessments remained unchanged prior to discharge.

## 2023-07-03 NOTE — ED Notes (Addendum)
Patient in the bedroom calm and composed sleeping comfortably. NAD. Respirations are even and unlabored. Will continue to monitor for safety.

## 2023-07-03 NOTE — ED Notes (Signed)
A second call was made to the [employer] to know ETA of the taxi.

## 2023-07-03 NOTE — ED Notes (Addendum)
wwww 

## 2023-07-16 IMAGING — DX DG CHEST 2V
2 series · 2 of 2 positions shown · non-contrast
Comparison: 03/16/2021

CLINICAL DATA: Cough syncope

EXAM:
CHEST - 2 VIEW

[w chest lat]
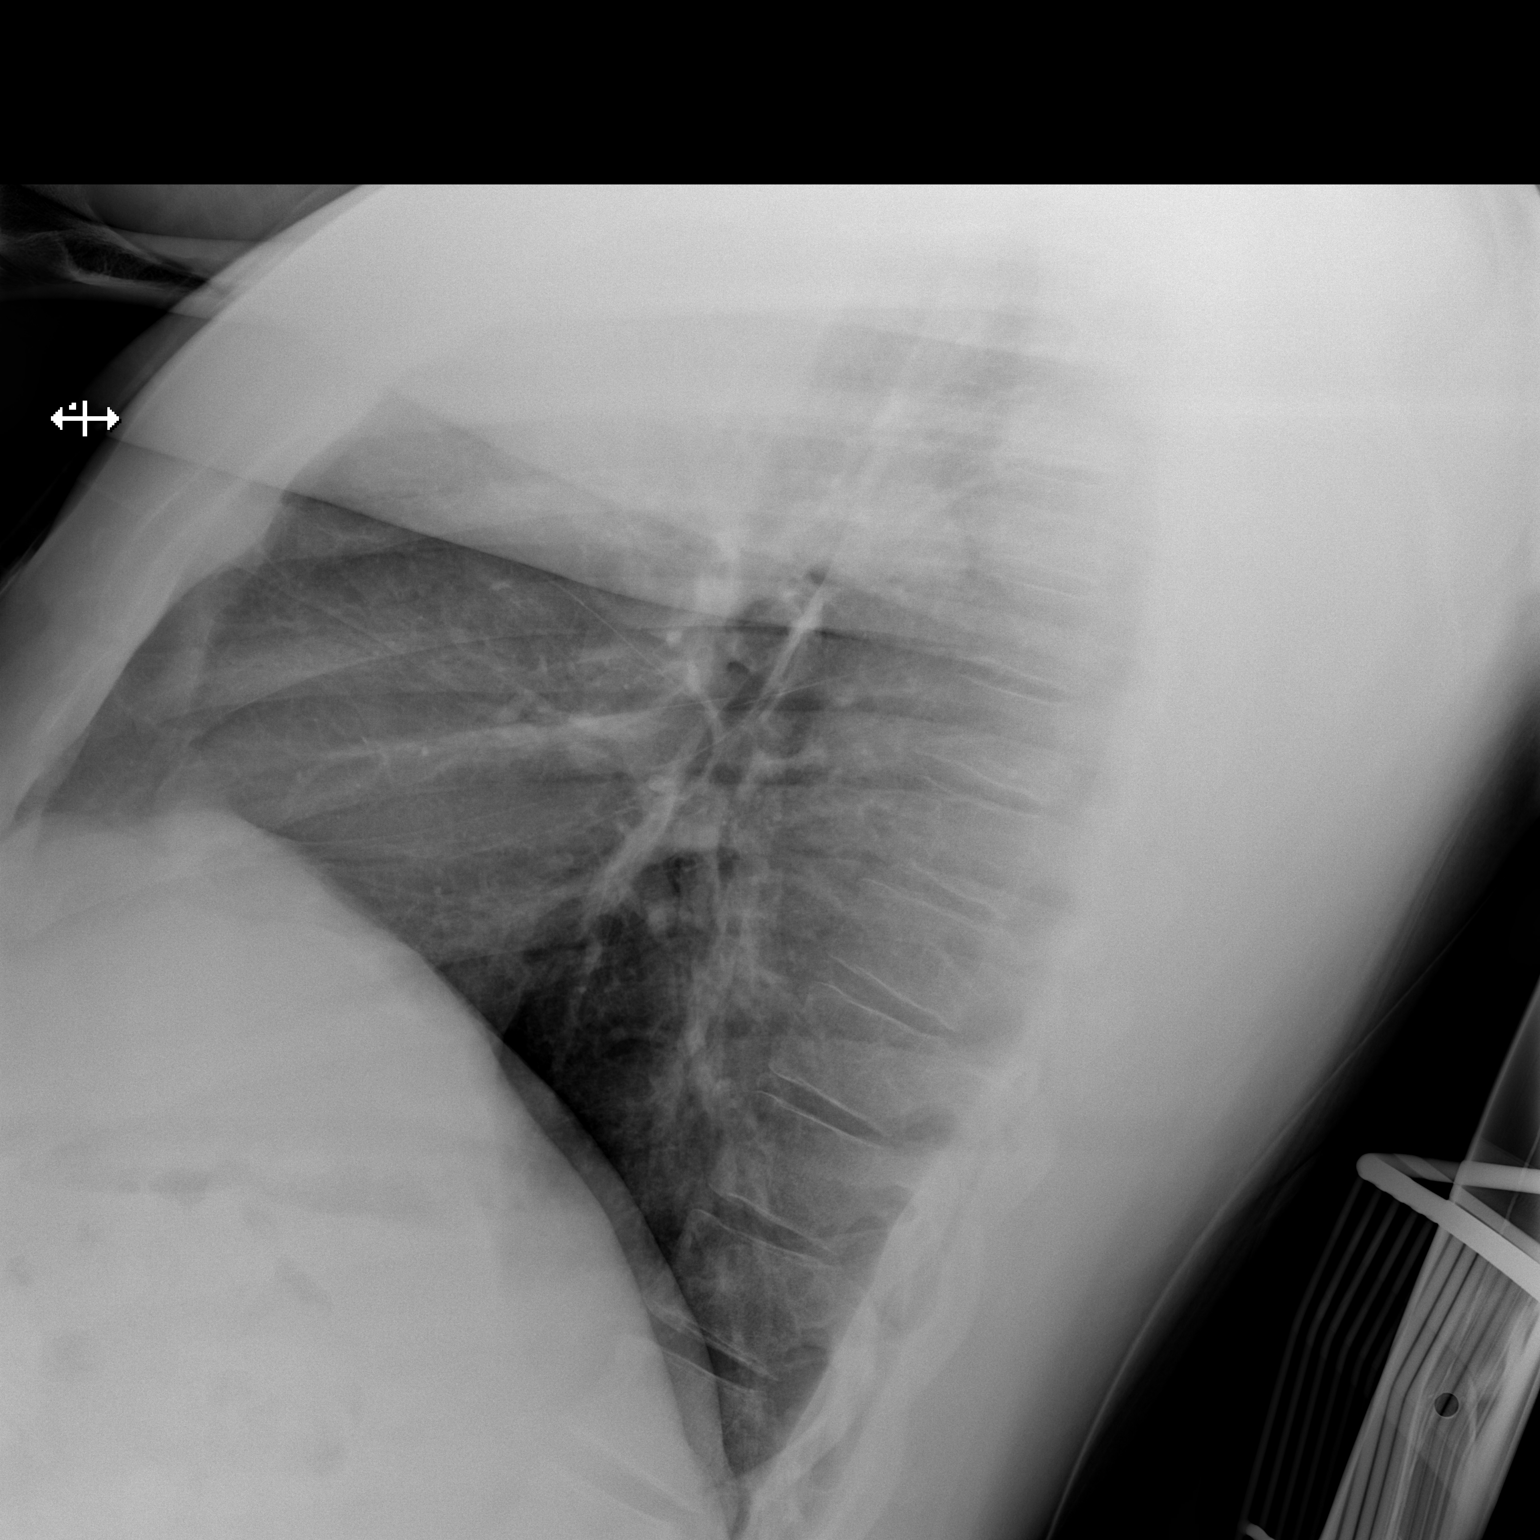

[x chest ap]
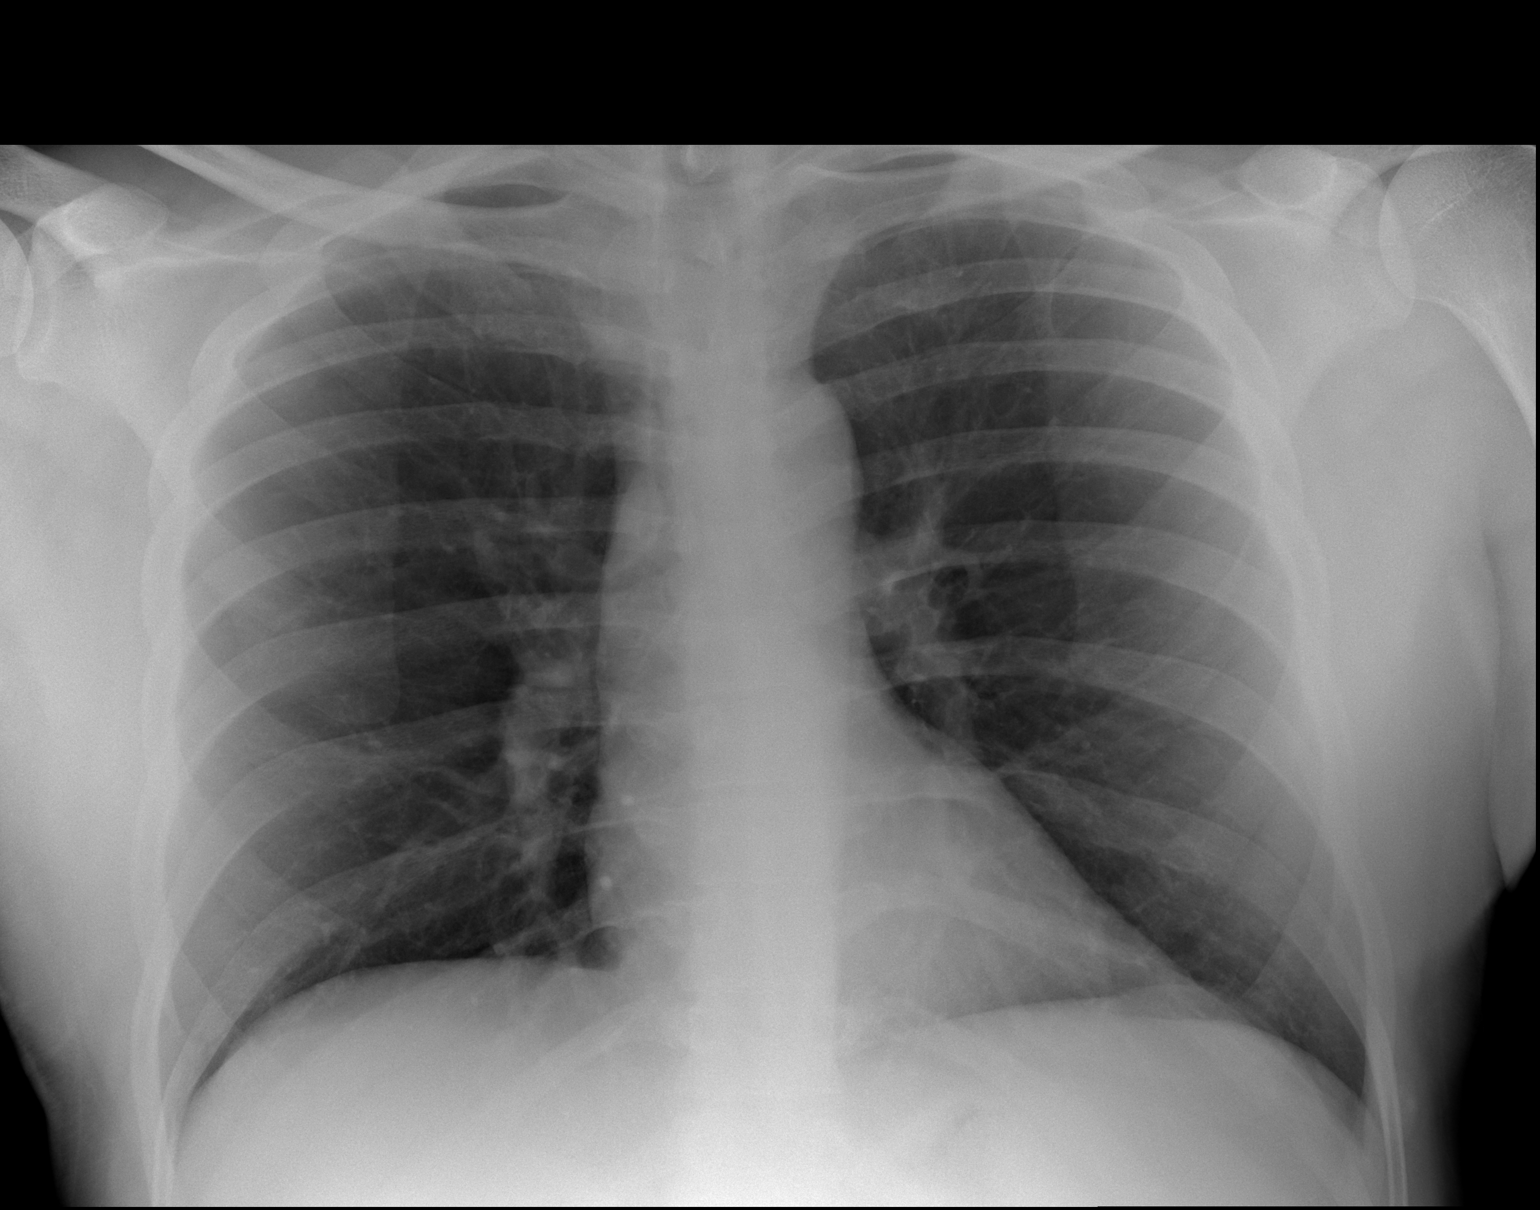

[2 of 2 positions shown; findings below may reference images not displayed]

FINDINGS: The heart size and mediastinal contours are within normal limits.
Both lungs are clear. The visualized skeletal structures are
unremarkable.
IMPRESSION: No active cardiopulmonary disease.

## 2023-07-16 IMAGING — DX DG ANKLE COMPLETE 3+V*L*
3 series · 3 of 3 positions shown · non-contrast
Comparison: 02/17/2007

CLINICAL DATA: Cough syncope

EXAM:
LEFT ANKLE COMPLETE - 3+ VIEW

[x ankle ap left]
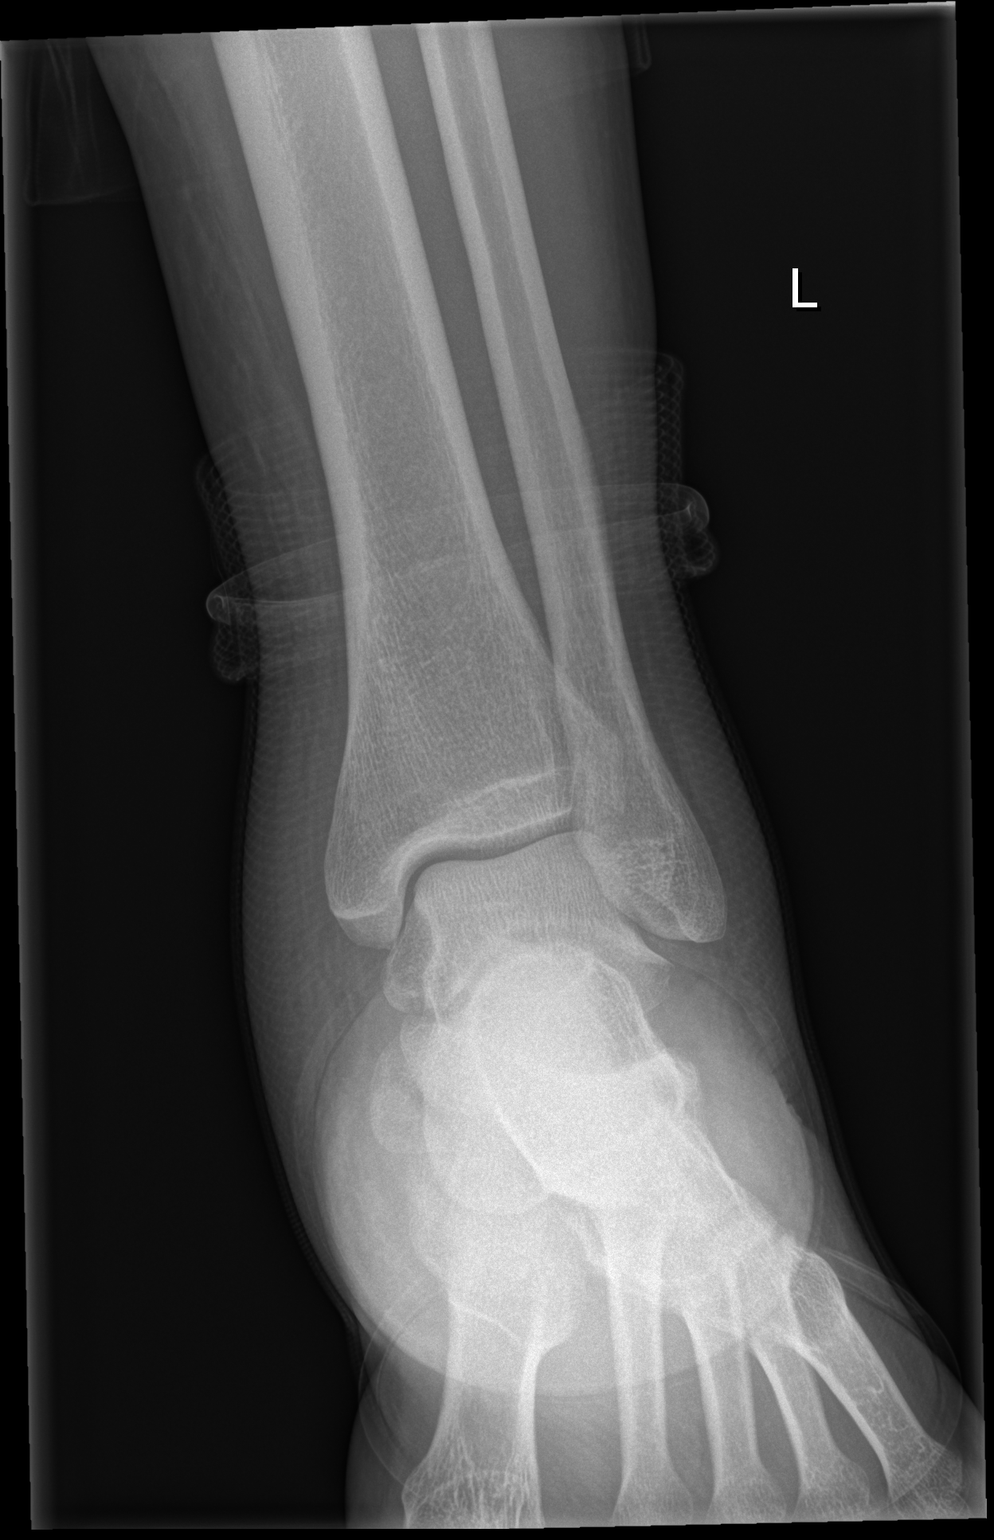

[x ankle obl left]
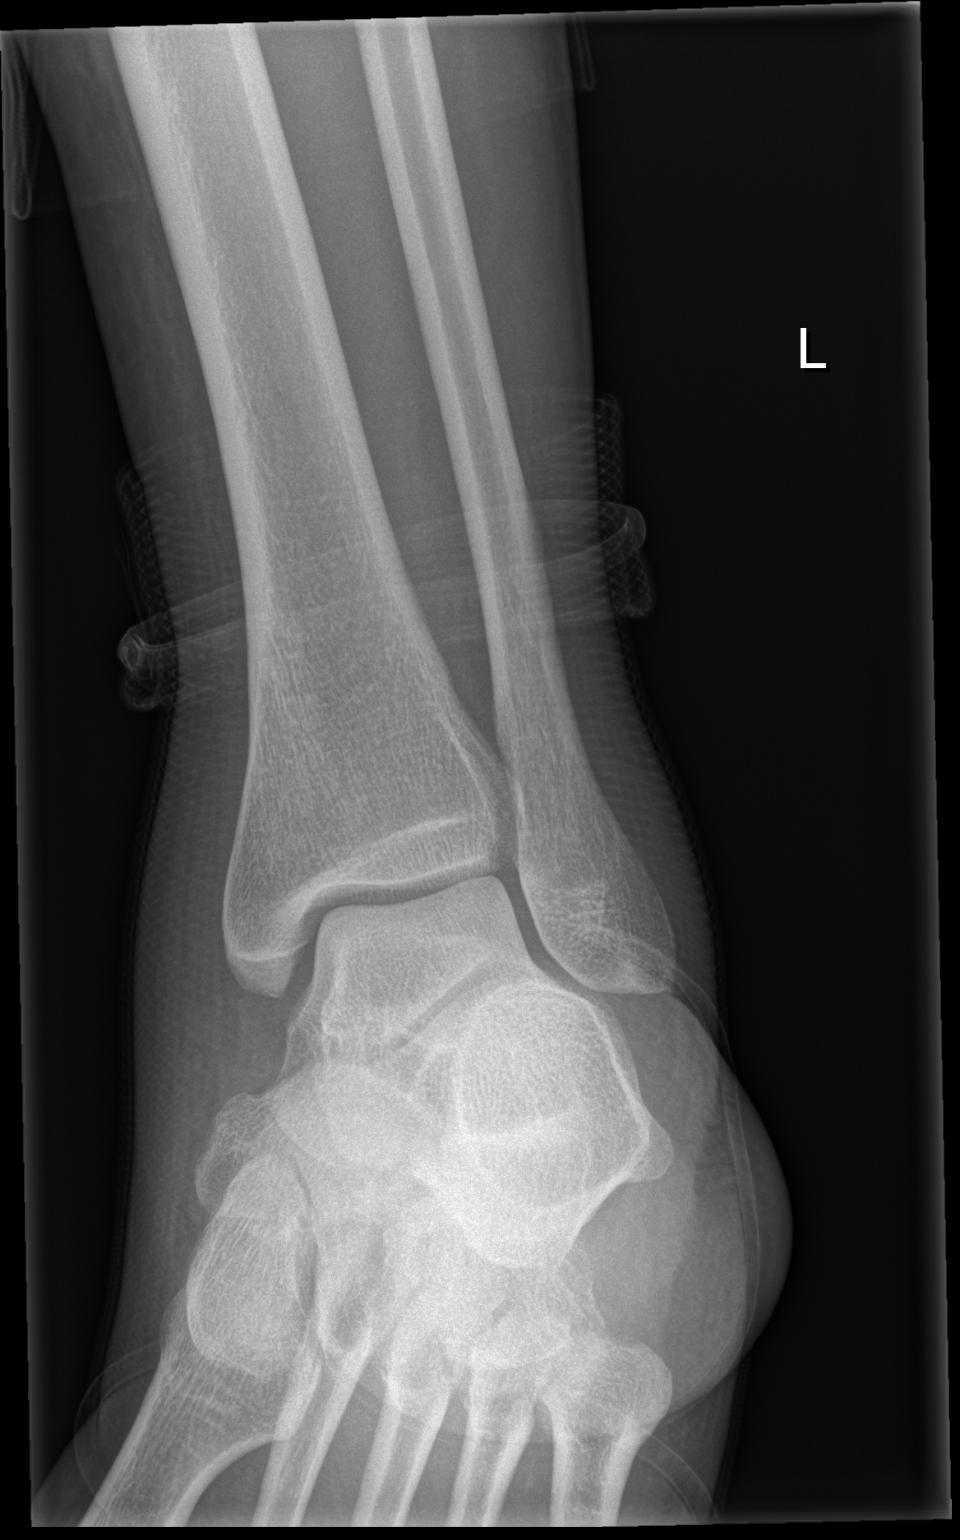

[x ankle lat left]
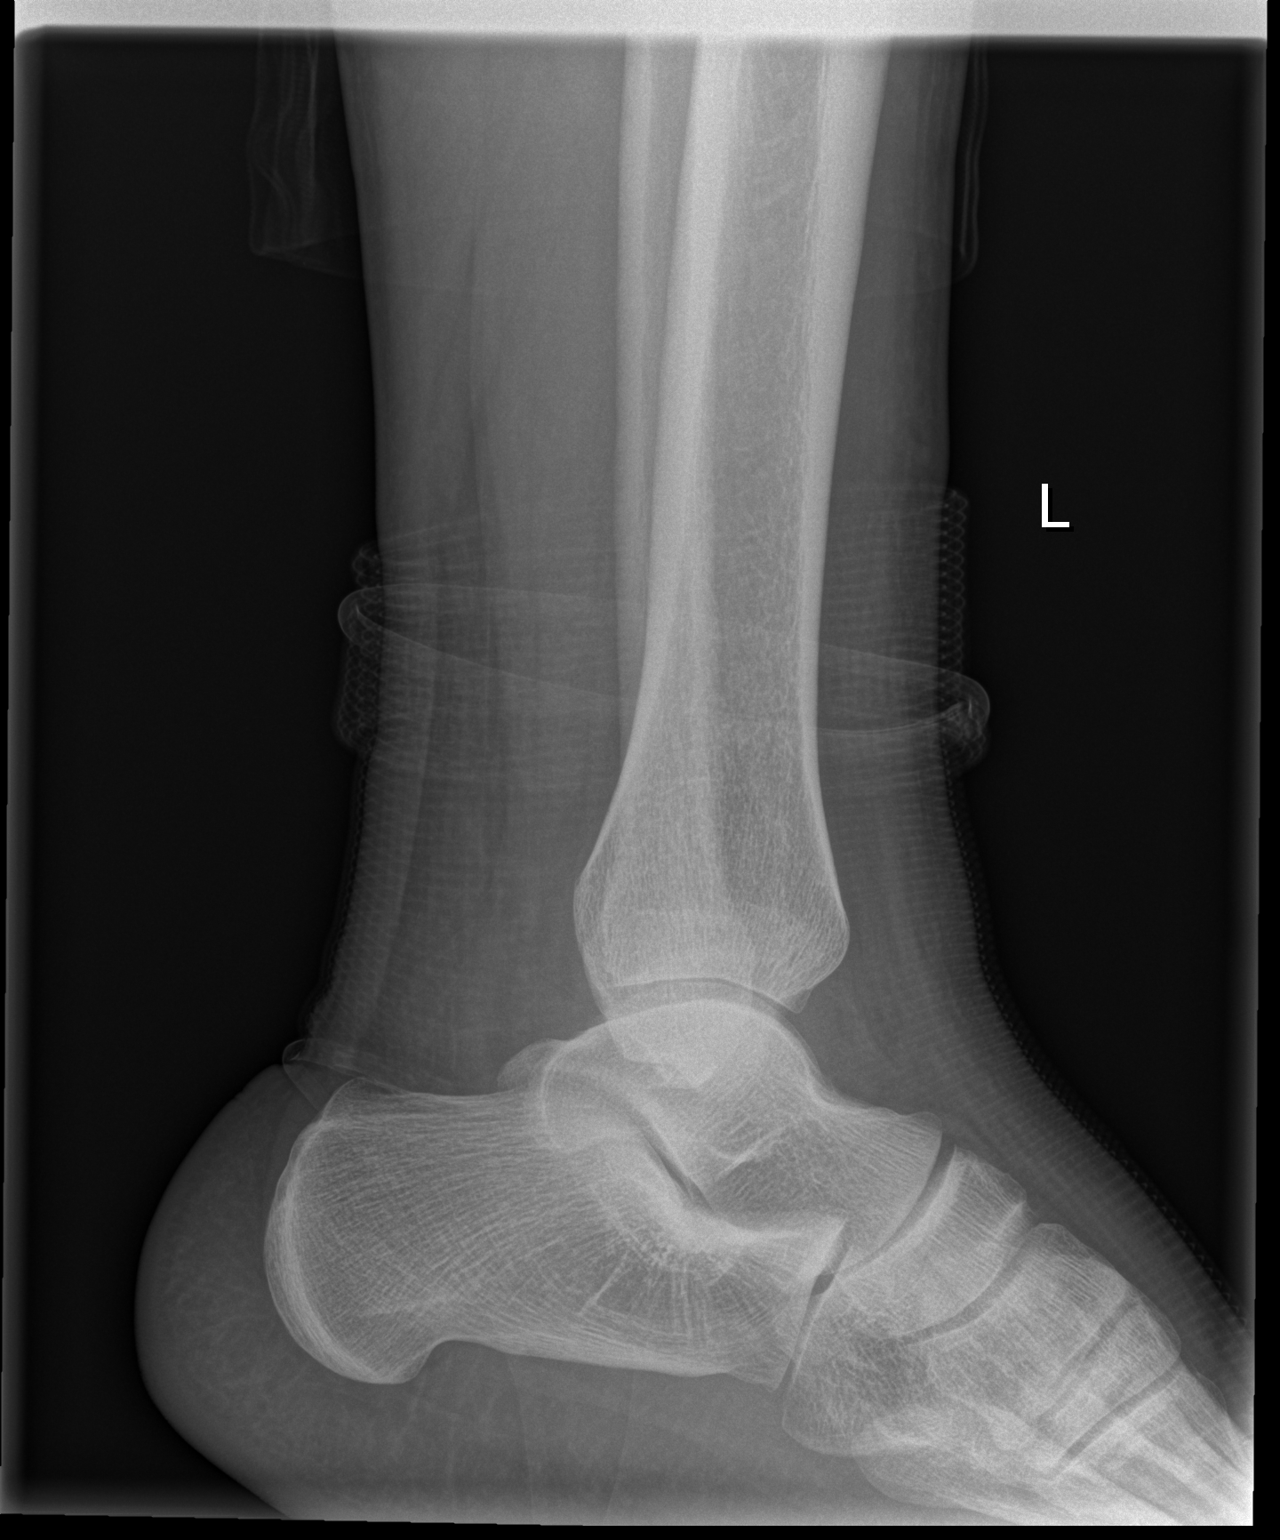

[3 of 3 positions shown; findings below may reference images not displayed]

FINDINGS: There is no evidence of fracture, dislocation, or joint effusion.
There is no evidence of arthropathy or other focal bone abnormality.
Soft tissues are unremarkable.
IMPRESSION: Negative.

## 2024-02-21 ENCOUNTER — Encounter (HOSPITAL_COMMUNITY): Payer: Self-pay

## 2024-02-21 ENCOUNTER — Emergency Department (HOSPITAL_COMMUNITY)
Admission: EM | Admit: 2024-02-21 | Discharge: 2024-02-22 | Disposition: A | Discharge status: Home / Self Care | Attending: Emergency Medicine | Admitting: Emergency Medicine

## 2024-02-21 ENCOUNTER — Emergency Department (HOSPITAL_COMMUNITY)

## 2024-02-21 ENCOUNTER — Other Ambulatory Visit: Payer: Self-pay

## 2024-02-21 DIAGNOSIS — F1994 Other psychoactive substance use, unspecified with psychoactive substance-induced mood disorder: Secondary | ICD-10-CM | POA: Diagnosis present

## 2024-02-21 DIAGNOSIS — R079 Chest pain, unspecified: Secondary | ICD-10-CM | POA: Diagnosis present

## 2024-02-21 DIAGNOSIS — R1011 Right upper quadrant pain: Secondary | ICD-10-CM | POA: Insufficient documentation

## 2024-02-21 DIAGNOSIS — Z79899 Other long term (current) drug therapy: Secondary | ICD-10-CM | POA: Diagnosis not present

## 2024-02-21 DIAGNOSIS — F1014 Alcohol abuse with alcohol-induced mood disorder: Secondary | ICD-10-CM | POA: Diagnosis not present

## 2024-02-21 DIAGNOSIS — F1020 Alcohol dependence, uncomplicated: Secondary | ICD-10-CM | POA: Diagnosis not present

## 2024-02-21 DIAGNOSIS — F319 Bipolar disorder, unspecified: Secondary | ICD-10-CM | POA: Diagnosis present

## 2024-02-21 DIAGNOSIS — Z560 Unemployment, unspecified: Secondary | ICD-10-CM | POA: Diagnosis not present

## 2024-02-21 DIAGNOSIS — F1220 Cannabis dependence, uncomplicated: Secondary | ICD-10-CM | POA: Diagnosis not present

## 2024-02-21 DIAGNOSIS — F1410 Cocaine abuse, uncomplicated: Secondary | ICD-10-CM | POA: Insufficient documentation

## 2024-02-21 DIAGNOSIS — F1490 Cocaine use, unspecified, uncomplicated: Secondary | ICD-10-CM

## 2024-02-21 DIAGNOSIS — F329 Major depressive disorder, single episode, unspecified: Secondary | ICD-10-CM | POA: Diagnosis not present

## 2024-02-21 DIAGNOSIS — F411 Generalized anxiety disorder: Secondary | ICD-10-CM | POA: Diagnosis present

## 2024-02-21 DIAGNOSIS — G4700 Insomnia, unspecified: Secondary | ICD-10-CM | POA: Diagnosis not present

## 2024-02-21 LAB — BASIC METABOLIC PANEL WITH GFR
Anion gap: 16 — ABNORMAL HIGH (ref 5–15)
BUN: 13 mg/dL (ref 6–20)
CO2: 21 mmol/L — ABNORMAL LOW (ref 22–32)
Calcium: 9.2 mg/dL (ref 8.9–10.3)
Chloride: 103 mmol/L (ref 98–111)
Creatinine, Ser: 1.25 mg/dL — ABNORMAL HIGH (ref 0.61–1.24)
GFR, Estimated: >60 mL/min (ref >60)
Glucose, Bld: 111 mg/dL — ABNORMAL HIGH (ref 70–99)
Potassium: 3.8 mmol/L (ref 3.5–5.1)
Sodium: 140 mmol/L (ref 135–145)

## 2024-02-21 LAB — CBC
HCT: 40.5 % (ref 39.0–52.0)
Hemoglobin: 13.1 g/dL (ref 13.0–17.0)
MCH: 28.4 pg (ref 26.0–34.0)
MCHC: 32.3 g/dL (ref 30.0–36.0)
MCV: 87.7 fL (ref 80.0–100.0)
Platelets: 306 K/uL (ref 150–400)
RBC: 4.62 MIL/uL (ref 4.22–5.81)
RDW: 13 % (ref 11.5–15.5)
WBC: 16.9 K/uL — ABNORMAL HIGH (ref 4.0–10.5)
nRBC: 0 % (ref 0.0–0.2)

## 2024-02-21 LAB — TROPONIN I (HIGH SENSITIVITY)
Troponin I (High Sensitivity): 5 ng/L (ref <18)
Troponin I (High Sensitivity): 6 ng/L (ref <18)

## 2024-02-21 MED ORDER — ACETAMINOPHEN 500 MG PO TABS
1000.0000 mg | ORAL_TABLET | Freq: Once | ORAL | Status: AC
  Administered 2024-02-21 (×2): 1000 mg via ORAL
  Filled 2024-02-21: qty 2

## 2024-02-21 MED ORDER — DIAZEPAM 5 MG/ML IJ SOLN
5.0000 mg | Freq: Once | INTRAMUSCULAR | Status: DC
  Filled 2024-02-21: qty 2

## 2024-02-21 MED ORDER — ASPIRIN 81 MG PO CHEW: 324.0000 mg | CHEWABLE_TABLET | Freq: Once | ORAL | Status: DC

## 2024-02-21 MED ORDER — SODIUM CHLORIDE 0.9 % IV BOLUS
1000.0000 mL | Freq: Once | INTRAVENOUS | Status: AC
  Administered 2024-02-21 (×2): 1000 mL via INTRAVENOUS

## 2024-02-21 MED ORDER — IBUPROFEN 800 MG PO TABS: 800.0000 mg | ORAL_TABLET | Freq: Once | ORAL | Status: DC

## 2024-02-21 NOTE — ED Triage Notes (Signed)
 Pt coming in from a hotel Pt reports taking cocaine and alcohol pt reports it feels off that he is not sure that it was cocaine because it doesn't feel like normal. pt reports warmness in chest.  Ems gave 250 fluid 324 Asprin and 2 of nItro EMS vitals 170/106 Cbg 138 Spo2 98% ra Hr 112  Rr 18  20g Left fa

## 2024-02-21 NOTE — ED Provider Notes (Signed)
 [place] EMERGENCY DEPARTMENT AT [facility] Provider Note   CSN: [id number] Arrival date & time: 02/21/24  2000     Patient presents with: Chest Pain   Kevin Gentry is a 38 y.o. male.  38 year old presents to the ED via EMS complaining of chest pain following cocaine use.  Additional EMS vitals were 170/100, heart rate 112, respiratory rate 18.  Patient reports he did some cocaine, marijuana, drink alcohol.  After he took this he said he felt dizzy felt weak.  He was outside in the grass where he said he slept.  Patient is currently complaining of chest pain and right upper extremity pain.  Patient denies any dizziness, nausea, vomiting at ED.  Patient reports he has done cocaine in the past without same symptoms.     Prior to Admission medications   Medication Sig Start Date End Date Taking? Authorizing Provider  mirtazapine  (REMERON ) 15 MG tablet Take 1 tablet (15 mg total) by mouth at bedtime. 07/02/23   White, Patrice L, NP  nicotine  (NICODERM CQ  - DOSED IN MG/24 HOURS) 21 mg/24hr patch Place 1 patch (21 mg total) onto the skin daily. 07/03/23   White, Wyline CROME, NP    Allergies: Patient has no known allergies.    Review of Systems  Cardiovascular:  Positive for chest pain.  Gastrointestinal:  Positive for abdominal pain.  All other systems reviewed and are negative.   Updated Vital Signs BP (!) 151/83   Pulse (!) 101   Temp 98.9 F (37.2 C) (Oral)   Resp 16   Ht 6' 3 (1.905 m)   Wt 113.4 kg   SpO2 98%   BMI 31.25 kg/m   Physical Exam Vitals and nursing note reviewed.  Constitutional:      General: He is not in acute distress.    Appearance: He is well-developed.  HENT:     Head: Normocephalic and atraumatic.  Eyes:     Conjunctiva/sclera: Conjunctivae normal.  Cardiovascular:     Rate and Rhythm: Normal rate and regular rhythm.     Heart sounds: Normal heart sounds. No murmur heard. Pulmonary:     Effort: Pulmonary effort is normal. No  respiratory distress.     Breath sounds: Normal breath sounds.  Abdominal:     Palpations: Abdomen is soft.     Tenderness: There is abdominal tenderness.  Musculoskeletal:        General: No swelling.     Cervical back: Neck supple.  Skin:    General: Skin is warm and dry.     Capillary Refill: Capillary refill takes less than 2 seconds.  Neurological:     Mental Status: He is alert.  Psychiatric:        Mood and Affect: Mood normal.     (all labs ordered are listed, but only abnormal results are displayed) Labs Reviewed  BASIC METABOLIC PANEL WITH GFR - Abnormal; Notable for the following components:      Result Value   CO2 21 (*)    Glucose, Bld 111 (*)    Creatinine, Ser 1.25 (*)    Anion gap 16 (*)    All other components within normal limits  CBC - Abnormal; Notable for the following components:   WBC 16.9 (*)    All other components within normal limits  TROPONIN I (HIGH SENSITIVITY)  TROPONIN I (HIGH SENSITIVITY)    EKG: EKG Interpretation Date/Time:  Monday February 21 2024 20:14:33 EDT Ventricular Rate:  110 PR  Interval:  148 QRS Duration:  84 QT Interval:  332 QTC Calculation: 449 R Axis:   7  Text Interpretation: Sinus tachycardia Septal infarct , age undetermined T wave abnormality, consider lateral ischemia Abnormal ECG When compared with ECG of 30-Jun-2023 23:22, PREVIOUS ECG IS PRESENT, T wave inverstions are now found Confirmed by Cleotilde Rogue ([id number]) on 02/21/2024 8:18:24 PM  Radiology: [facility] Chest 2 View Result Date: 02/21/2024 CLINICAL DATA:  Chest pain.  Alcohol and cocaine use today. EXAM: CHEST - 2 VIEW COMPARISON:  Radiograph 12 FINDINGS: The cardiomediastinal contours are normal. The lungs are clear. Pulmonary vasculature is normal. No consolidation, pleural effusion, or pneumothorax. No acute osseous abnormalities are seen. IMPRESSION: Negative radiographs of the chest. Electronically Signed   By: Andrea Gasman M.D.   On: 02/21/2024 21:18      Procedures   Medications Ordered in the ED  ibuprofen  (ADVIL ) tablet 800 mg (has no administration in time range)  acetaminophen  (TYLENOL ) tablet 1,000 mg (1,000 mg Oral Given 02/21/24 2115)  sodium chloride  0.9 % bolus 1,000 mL (1,000 mLs Intravenous New Bag/Given 02/21/24 2256)                                   Medical Decision Making Amount and/or Complexity of Data Reviewed Labs: ordered. Radiology: ordered.  Risk OTC drugs.   38 y.o. male presents to the ED for concern of Chest Pain     This involves an extensive number of treatment options, and is a complaint that carries with it a high risk of complications and morbidity.  The emergent differential diagnosis prior to evaluation includes, but is not limited to: ACS, cocaine toxicity, aortic dissection, dysrhythmias, pneumothorax.  This is not an exhaustive differential.   Past Medical History / Co-morbidities / Social History: Hx of hypertriglyceridemia and bipolar and alcohol abuse Social Determinants of Health include: Polysubstance abuse.  Additional History:  Obtained by chart review.  Notably workups for substance abuse.  Lab Tests: I ordered, and personally interpreted labs.  The pertinent results include:   Elevated white count on CBC Troponins negative  Imaging Studies: I ordered imaging studies including x-rays  I independently visualized and interpreted imaging which showed no acute abnormalities I agree with the radiologist interpretation.  Cardiac Monitoring: The patient was maintained on a cardiac monitor.  I personally viewed and interpreted the cardiac monitored which showed an underlying rhythm of: Sinus tachycardia with T wave changes. EKG was interpreted by attending.  ED Course / Critical Interventions: Pt anxious appearing on exam.  Patient is reporting of chest pain and pain in the right abdominal upper quadrant.  Patient reports chest pain is 8 out of 10.  Patient is not diaphoretic  but appears slightly anxious on exam.  Patient has no tenderness to palpation in the abdomen.  Lungs are clear to auscultation.  Patient denies any dizziness currently but reports dizziness after ingesting cocaine.  Initial troponin is negative and EKG shows T wave changes.  Chest x-ray has no acute changes and no concern for pneumothorax or aortic dissection.  Upon reevaluation, patient is lying comfortably in the ED bed sleeping.  Patient reports he feels much better and the pain is 5 out of 10.  Patient is no longer complaining of abdominal pain.  Delta troponin was negative.  Patient was still tachycardic but calm in bed sleeping it was decided with attending to give patient 1 L fluid  and ibuprofen .  Follow-up EKG after 1 L of fluid.  I have reviewed the patients home medicines and have made adjustments as needed.  Patient care transferred to [facility]   I discussed this case with my attending, Dr. Cleotilde, who agreed with the proposed treatment course and cosigned this note including patient's presenting symptoms, physical exam, and planned diagnostics and interventions.  Attending physician stated agreement with plan or made changes to plan which were implemented.     This chart was dictated using voice recognition software.  Despite best efforts to proofread, errors can occur which can change the documentation meaning.   Final diagnoses:  None    ED Discharge Orders     None          Myriam Fonda GORMAN DEVONNA 02/21/24 2326    Cleotilde Rogue, MD 02/22/24 1242

## 2024-02-21 NOTE — ED Provider Triage Note (Signed)
 Emergency Medicine Provider Triage Evaluation Note  Kevin Gentry , a 38 y.o. male  was evaluated in triage.  Pt complains of 38 year old male denies prior cardiac history presents after taking a hit of cocaine, taking a nap and waking up with sharp tingly pain in the middle of his chest that is worse with taking a breath, no history of exertional symptoms.  Review of Systems  Positive: Chest pain Negative: Shortness of breath  Physical Exam  BP (!) 140/103 (BP Location: Right Arm)   Pulse (!) 111   Temp 99.7 F (37.6 C) (Oral)   Resp 18   Ht 1.905 m (6' 3)   Wt 113.4 kg   SpO2 95%   BMI 31.25 kg/m  Gen:   Awake, no distress   Resp:  Normal effort  MSK:   Moves extremities without difficulty  Other:  No edema, clear heart sounds, clear lung sounds  Medical Decision Making  Medically screening exam initiated at 8:16 PM.  Appropriate orders placed.  Kevin Gentry was informed that the remainder of the evaluation will be completed by another provider, this initial triage assessment does not replace that evaluation, and the importance of remaining in the ED until their evaluation is complete.  Patient with abnormal EKG, has diffuse T wave inversions, will be brought to a bed for workup.  At this time blood pressure is hypertensive but only 140/103, he is slightly tachycardic at about 110, no findings of lower extremity edema asymmetry or risk for pulmonary embolism, suspect this is cocaine chest pain, rule out ischemia   Kevin Rogue, MD 02/21/24 2017

## 2024-02-22 ENCOUNTER — Other Ambulatory Visit (INDEPENDENT_AMBULATORY_CARE_PROVIDER_SITE_OTHER)
Admission: EM | Admit: 2024-02-22 | Discharge: 2024-02-28 | Disposition: A | Discharge status: Home / Self Care | Source: Intra-hospital | Attending: Psychiatry | Admitting: Psychiatry

## 2024-02-22 ENCOUNTER — Other Ambulatory Visit (INDEPENDENT_AMBULATORY_CARE_PROVIDER_SITE_OTHER)
Admission: EM | Admit: 2024-02-22 | Discharge: 2024-02-22 | Disposition: A | Discharge status: Other Acute Inpatient Hospital | Source: Home / Self Care | Attending: Psychiatry | Admitting: Psychiatry

## 2024-02-22 DIAGNOSIS — F329 Major depressive disorder, single episode, unspecified: Secondary | ICD-10-CM | POA: Insufficient documentation

## 2024-02-22 DIAGNOSIS — G4700 Insomnia, unspecified: Secondary | ICD-10-CM | POA: Insufficient documentation

## 2024-02-22 DIAGNOSIS — Z79899 Other long term (current) drug therapy: Secondary | ICD-10-CM | POA: Insufficient documentation

## 2024-02-22 DIAGNOSIS — F1220 Cannabis dependence, uncomplicated: Secondary | ICD-10-CM

## 2024-02-22 DIAGNOSIS — F1020 Alcohol dependence, uncomplicated: Secondary | ICD-10-CM | POA: Insufficient documentation

## 2024-02-22 DIAGNOSIS — Z560 Unemployment, unspecified: Secondary | ICD-10-CM | POA: Insufficient documentation

## 2024-02-22 DIAGNOSIS — R079 Chest pain, unspecified: Secondary | ICD-10-CM | POA: Diagnosis not present

## 2024-02-22 DIAGNOSIS — F1410 Cocaine abuse, uncomplicated: Secondary | ICD-10-CM

## 2024-02-22 DIAGNOSIS — F1014 Alcohol abuse with alcohol-induced mood disorder: Secondary | ICD-10-CM | POA: Insufficient documentation

## 2024-02-22 DIAGNOSIS — F1994 Other psychoactive substance use, unspecified with psychoactive substance-induced mood disorder: Secondary | ICD-10-CM | POA: Diagnosis not present

## 2024-02-22 DIAGNOSIS — F1090 Alcohol use, unspecified, uncomplicated: Secondary | ICD-10-CM

## 2024-02-22 LAB — URINALYSIS, COMPLETE (UACMP) WITH MICROSCOPIC
Bacteria, UA: NONE SEEN
Bilirubin Urine: NEGATIVE
Glucose, UA: NEGATIVE mg/dL
Hgb urine dipstick: NEGATIVE
Ketones, ur: 5 mg/dL — AB
Leukocytes,Ua: NEGATIVE
Nitrite: NEGATIVE
Protein, ur: 30 mg/dL — AB
Specific Gravity, Urine: 1.014 (ref 1.005–1.030)
pH: 5 (ref 5.0–8.0)

## 2024-02-22 LAB — POCT URINE DRUG SCREEN - MANUAL ENTRY (I-SCREEN)
POC Amphetamine UR: NOT DETECTED
POC Buprenorphine (BUP): NOT DETECTED
POC Cocaine UR: POSITIVE — AB
POC Marijuana UR: POSITIVE — AB
POC Methadone UR: NOT DETECTED
POC Methamphetamine UR: NOT DETECTED
POC Morphine: NOT DETECTED
POC Oxazepam (BZO): NOT DETECTED
POC Oxycodone UR: NOT DETECTED
POC Secobarbital (BAR): NOT DETECTED

## 2024-02-22 LAB — CBC WITH DIFFERENTIAL/PLATELET
Abs Immature Granulocytes: 0.05 K/uL (ref 0.00–0.07)
Basophils Absolute: 0 K/uL (ref 0.0–0.1)
Basophils Relative: 0 %
Eosinophils Absolute: 0.2 K/uL (ref 0.0–0.5)
Eosinophils Relative: 2 %
HCT: 39.1 % (ref 39.0–52.0)
Hemoglobin: 13 g/dL (ref 13.0–17.0)
Immature Granulocytes: 1 %
Lymphocytes Relative: 18 %
Lymphs Abs: 1.8 K/uL (ref 0.7–4.0)
MCH: 28.4 pg (ref 26.0–34.0)
MCHC: 33.2 g/dL (ref 30.0–36.0)
MCV: 85.4 fL (ref 80.0–100.0)
Monocytes Absolute: 1.1 K/uL — ABNORMAL HIGH (ref 0.1–1.0)
Monocytes Relative: 10 %
Neutro Abs: 7.2 K/uL (ref 1.7–7.7)
Neutrophils Relative %: 69 %
Platelets: 290 K/uL (ref 150–400)
RBC: 4.58 MIL/uL (ref 4.22–5.81)
RDW: 12.9 % (ref 11.5–15.5)
WBC: 10.5 K/uL (ref 4.0–10.5)
nRBC: 0 % (ref 0.0–0.2)

## 2024-02-22 LAB — LIPID PANEL
Cholesterol: 134 mg/dL (ref 0–200)
HDL: 46 mg/dL (ref >40)
LDL Cholesterol: 45 mg/dL (ref 0–99)
Total CHOL/HDL Ratio: 2.9 ratio
Triglycerides: 213 mg/dL — ABNORMAL HIGH (ref <150)
VLDL: 43 mg/dL — ABNORMAL HIGH (ref 0–40)

## 2024-02-22 LAB — COMPREHENSIVE METABOLIC PANEL WITH GFR
ALT: 65 U/L — ABNORMAL HIGH (ref 0–44)
AST: 53 U/L — ABNORMAL HIGH (ref 15–41)
Albumin: 3.9 g/dL (ref 3.5–5.0)
Alkaline Phosphatase: 41 U/L (ref 38–126)
Anion gap: 12 (ref 5–15)
BUN: 13 mg/dL (ref 6–20)
CO2: 26 mmol/L (ref 22–32)
Calcium: 9 mg/dL (ref 8.9–10.3)
Chloride: 103 mmol/L (ref 98–111)
Creatinine, Ser: 1.08 mg/dL (ref 0.61–1.24)
GFR, Estimated: >60 mL/min (ref >60)
Glucose, Bld: 101 mg/dL — ABNORMAL HIGH (ref 70–99)
Potassium: 3.5 mmol/L (ref 3.5–5.1)
Sodium: 141 mmol/L (ref 135–145)
Total Bilirubin: 0.9 mg/dL (ref 0.0–1.2)
Total Protein: 6.7 g/dL (ref 6.5–8.1)

## 2024-02-22 LAB — ETHANOL: Alcohol, Ethyl (B): <15 mg/dL (ref <15)

## 2024-02-22 LAB — MAGNESIUM: Magnesium: 2.1 mg/dL (ref 1.7–2.4)

## 2024-02-22 LAB — TSH: TSH: 2.674 u[IU]/mL (ref 0.350–4.500)

## 2024-02-22 MED ORDER — MAGNESIUM HYDROXIDE 400 MG/5ML PO SUSP: 30.0000 mL | Freq: Every day | ORAL | Status: DC | PRN

## 2024-02-22 MED ORDER — NICOTINE 21 MG/24HR TD PT24: 21.0000 mg | MEDICATED_PATCH | Freq: Every day | TRANSDERMAL | Status: DC

## 2024-02-22 MED ORDER — DIPHENHYDRAMINE HCL 50 MG PO CAPS: 50.0000 mg | ORAL_CAPSULE | Freq: Three times a day (TID) | ORAL | Status: DC | PRN

## 2024-02-22 MED ORDER — LORAZEPAM 2 MG/ML IJ SOLN: 2.0000 mg | Freq: Three times a day (TID) | INTRAMUSCULAR | Status: DC | PRN

## 2024-02-22 MED ORDER — HALOPERIDOL LACTATE 5 MG/ML IJ SOLN: 5.0000 mg | Freq: Three times a day (TID) | INTRAMUSCULAR | Status: DC | PRN

## 2024-02-22 MED ORDER — CLONIDINE HCL 0.1 MG PO TABS: 0.1000 mg | ORAL_TABLET | Freq: Two times a day (BID) | ORAL | Status: DC | PRN

## 2024-02-22 MED ORDER — ALUM & MAG HYDROXIDE-SIMETH 200-200-20 MG/5ML PO SUSP: 30.0000 mL | ORAL | Status: DC | PRN

## 2024-02-22 MED ORDER — DIPHENHYDRAMINE HCL 50 MG/ML IJ SOLN: 50.0000 mg | Freq: Three times a day (TID) | INTRAMUSCULAR | Status: DC | PRN

## 2024-02-22 MED ORDER — HALOPERIDOL LACTATE 5 MG/ML IJ SOLN: 10.0000 mg | Freq: Three times a day (TID) | INTRAMUSCULAR | Status: DC | PRN

## 2024-02-22 MED ORDER — HALOPERIDOL 5 MG PO TABS: 5.0000 mg | ORAL_TABLET | Freq: Three times a day (TID) | ORAL | Status: DC | PRN

## 2024-02-22 MED ORDER — ADULT MULTIVITAMIN W/MINERALS CH: 1.0000 | ORAL_TABLET | Freq: Every day | ORAL | Status: DC

## 2024-02-22 MED ORDER — TRAZODONE HCL 50 MG PO TABS: 50.0000 mg | ORAL_TABLET | Freq: Every evening | ORAL | Status: DC | PRN

## 2024-02-22 MED ORDER — LORAZEPAM 1 MG PO TABS: 1.0000 mg | ORAL_TABLET | Freq: Three times a day (TID) | ORAL | Status: DC

## 2024-02-22 MED ORDER — LORAZEPAM 1 MG PO TABS: 1.0000 mg | ORAL_TABLET | Freq: Every day | ORAL | Status: DC

## 2024-02-22 MED ORDER — ACETAMINOPHEN 325 MG PO TABS: 650.0000 mg | ORAL_TABLET | Freq: Four times a day (QID) | ORAL | Status: DC | PRN

## 2024-02-22 MED ORDER — ONDANSETRON 4 MG PO TBDP: 4.0000 mg | ORAL_TABLET | Freq: Four times a day (QID) | ORAL | Status: DC | PRN

## 2024-02-22 MED ORDER — CLONIDINE HCL 0.1 MG PO TABS: 0.1000 mg | ORAL_TABLET | Freq: Once | ORAL | Status: DC

## 2024-02-22 MED ORDER — LOPERAMIDE HCL 2 MG PO CAPS: 2.0000 mg | ORAL_CAPSULE | ORAL | Status: DC | PRN

## 2024-02-22 MED ORDER — HYDROXYZINE HCL 25 MG PO TABS: 25.0000 mg | ORAL_TABLET | Freq: Three times a day (TID) | ORAL | Status: DC | PRN

## 2024-02-22 MED ORDER — THIAMINE MONONITRATE 100 MG PO TABS: 100.0000 mg | ORAL_TABLET | Freq: Every day | ORAL | Status: DC

## 2024-02-22 MED ORDER — LORAZEPAM 1 MG PO TABS: 1.0000 mg | ORAL_TABLET | Freq: Four times a day (QID) | ORAL | Status: DC

## 2024-02-22 MED ORDER — THIAMINE HCL 100 MG/ML IJ SOLN: 100.0000 mg | Freq: Once | INTRAMUSCULAR | Status: DC

## 2024-02-22 MED ORDER — LORAZEPAM 1 MG PO TABS: 1.0000 mg | ORAL_TABLET | Freq: Two times a day (BID) | ORAL | Status: DC
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
## 2024-02-22 NOTE — Group Note (Signed)
 Group Topic: Relapse and Recovery  Group Date: 02/22/2024 Start Time: 1915 End Time: 2015 Facilitators: Joan Plowman B  Department: [facility]  Number of Participants: 5  Group Focus: abuse issues and relapse prevention Treatment Modality:  Spiritual Interventions utilized were leisure development, patient education, story telling, and support Purpose: enhance coping skills, increase insight, and relapse prevention strategies  Name: Kevin Gentry Date of Birth: 1985-11-18  MR: [id number]    Level of Participation: active Quality of Participation: attentive and cooperative Interactions with others: gave feedback Mood/Affect: appropriate Triggers (if applicable): NA Cognition: coherent/clear Progress: Gaining insight Response: NA Plan: patient will be encouraged to keep going to groups.  Patients Problems:  Patient Active Problem List   Diagnosis Date Noted   Substance induced mood disorder (HCC) 02/22/2024   Alcohol abuse 06/30/2023   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

## 2024-02-22 NOTE — ED Notes (Signed)
 Pt observed to have eaten lunch and to have taken shower prior to lunch. To concerns or complaints expressed to RN at this time.

## 2024-02-22 NOTE — Group Note (Signed)
 Group Topic: Emotional Regulation  Group Date: 02/22/2024 Start Time: 1710 End Time: 1725 Facilitators: Daved Tinnie HERO, RN  Department: [facility]  Number of Participants: 6  Group Focus: communication Treatment Modality:  Psychoeducation Interventions utilized were patient education Purpose: express feelings  Name: Kevin Gentry Date of Birth: Mar 02, 1986  MR: [id number]    Level of Participation: moderate Quality of Participation: attentive and cooperative Interactions with others: gave feedback Mood/Affect: appropriate Triggers (if applicable): n/a Cognition: coherent/clear Progress: Significant Response: to manage and increase in awareness of their feelings and emotions, pt says he will start by gaining understanding about feelings/ emotions and their importance Plan: patient will be encouraged to attend future RN education groups  Patients Problems:  Patient Active Problem List   Diagnosis Date Noted   Substance induced mood disorder (HCC) 02/22/2024   Alcohol abuse 06/30/2023   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

## 2024-02-22 NOTE — Progress Notes (Signed)
   02/22/24 0804  [facility] Triage Screening (Walk-ins at [facility] only)  How Did You Hear About Us ? Self  What Is the Reason for Your Visit/Call Today? Patient is a 38 year old male who presents voluntarily to [facility] unaccompanied for detox.  Patient denies SI or HI or AVH.  He reports alcohol use daily, drinking about 440 ounce or a gallon or 2/5 depending on his monthly he also reports using cocaine daily, amounts unknown.  Patient also endorses using marijuana daily and often pills whenever he can afford it also amount unknown.  How Long Has This Been Causing You Problems? > than 6 months  Have You Recently Had Any Thoughts About Hurting Yourself? No  Are You Planning to Commit Suicide/Harm Yourself At This time? No  Have you Recently Had Thoughts About Hurting Someone Sherral? No  Are You Planning To Harm Someone At This Time? No  Physical Abuse Denies  Verbal Abuse Denies  Sexual Abuse Denies  Exploitation of patient/patient's resources Denies  Self-Neglect Denies  Possible abuse reported to:  (n/a)  Are you currently experiencing any auditory, visual or other hallucinations? No  Have You Used Any Alcohol or Drugs in the Past 24 Hours? Yes  What Did You Use and How Much? Beer, wine and liquor-he consumes as much as he can afford.  Do you have any current medical co-morbidities that require immediate attention? No  Clinician description of patient physical appearance/behavior: Patient appeared calm and cooperative  What Do You Feel Would Help You the Most Today? Alcohol or Drug Use Treatment  If access to [facility] was not available, would you have sought care in the Emergency Department? No  Determination of Need Urgent (48 hours)  Options For Referral Facility-Based Crisis  Determination of Need filed? Yes

## 2024-02-22 NOTE — ED Notes (Signed)
 Pt admitted to unit, consents signed and discussed, questions denied. Pt provided with toiletries and is currently taking a shower. Pt denies current si hi avh- verbal contract for safety provided.

## 2024-02-22 NOTE — Group Note (Signed)
 Group Topic: Recovery Basics  Group Date: 02/22/2024 Start Time: 9184 End Time: 0930 Facilitators: Lonzell Dwayne RAMAN, NT  Department: [facility]  Number of Participants: 3  Group Focus: chemical dependency issues Treatment Modality:  Psychoeducation Interventions utilized were confrontation Purpose: explore maladaptive thinking  Name: Kevin Gentry Date of Birth: [birth year]  MR: [id number]    Level of Participation: Patient was not here to attend group Quality of Participation: N/A Interactions with others: N/A Mood/Affect: N/A Triggers (if applicable): N/A Cognition: N/A Progress: N/A Response: N/A Plan: N/A Patients Problems:  Patient Active Problem List   Diagnosis Date Noted   Substance induced mood disorder (HCC) 02/22/2024   Alcohol abuse 06/30/2023   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

## 2024-02-22 NOTE — ED Provider Notes (Signed)
  Tachycardia has resolved.  EKG largely unchanged from prior.  Patient has been resting, no acute complaints.  Stable for discharge.  Encouraged to refrain from further cocaine use.  Return precautions given.   Jarold Olam HERO, PA-C 02/22/24 [id number]    Carita Senior, MD 02/22/24 [phone]

## 2024-02-22 NOTE — ED Provider Notes (Signed)
 Facility Based Crisis Admission H&P  Date: 02/22/24 Patient Name: Kevin Gentry Chief Complaint: worsening cocaine & alcohol abuse with depressive symptoms.  Diagnoses:  Final diagnoses:  Alcohol use disorder  Cocaine use disorder (HCC)  Tetrahydrocannabinol (THC) use disorder, severe, dependence (HCC)   HPI: Kevin Gentry is a 38 y.o. male who walked into the [facility] today with complaints of worsening depressive symptoms and polysubstance abuse requesting detoxification as well as rehabilitation placement.  Assessment: On assessment, patient is somnolent, verbalizes regret about how his life has turned out, reports that if he keeps going down this past, he will end up dead, reports determination to get the help that he needs.  Reports that he walked from the [facility], ED to this location today seeking help for his substance abuse, states that he would like to go to rehabilitation with a goal of regaining and maintaining his sobriety.  Patient reports a lifelong battle with substance addiction; reports that he started using alcohol at the age of 38 years old.  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  Patient is questioned what stores would like him purchase alcohol at that tender age, and he shares that he would pay some adults to purchase the alcoholic beverages for him.  Patient reports that as he got older, they have been worsened.  He reports that he currently drinks half a gallon of vodka daily and has been doing this times a couple of years.  He also reports using 2 g of cocaine every 2 to 3 weeks, and he uses daily THC, unable to quantify the amount of THC that he uses.  He however states that he smokes it,eats it via gummies, vapes it, etc. denies any other substance use other than the ones mentioned above.  Reports that he uses substance as a means to escape  emotional ties to anything.  Patient reports that yesterday, he drank a lot of alcohol, smokes of THC, and also used a lot of cocaine, is unable to recall where he works, but woke up in the field, was unable to recall how he got there, his phone was dead, but he was able to walk somewhat to get help, and the EMS was called, and he was transported to the [facility], ER.  Patient reports that he was treated there, but discharged earlier today morning, then walked himself to this location.  Patient reports that he was determined to walk here so that he can get the help that he needs.   Patient reports depressive symptoms including insomnia, anhedonia, feelings of guilt regarding his substance use, poor concentration levels, decreased appetite, describes symptoms consistent with psychomotor retardation, reports worsening feelings of hopelessness, helplessness, frustration, regarding his inability to stop his substance use.  Patient reports symptoms consistent with GAD, restlessness, muscle tension, recurrent worrying.  Denies panic type symptoms, denies symptoms consistent with other mental health problems.  Reports a diagnosis of bipolar disorder, reports always having an increased energy level.  Patient reports that he resides with the mother of his children, who are ages 22, 76, and 5.  Reports that he currently does not work, but receives SSI income due to his bipolar disorder, reports other diagnoses as GAD, and MDD.  Reports being on medications in the past for his mental health, states that he was on Depakote, Wellbutrin, Seroquel, but it has been at  least 5 years since he took any medications mental health related, reports that he stopped taking medications, because he did not like the way the medications were making him feel.  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  States that his father is his emergency contact person,  provided them as Ozell Bihari at [phone], listed this number down, but would not consent for call being placed at this time.  Patient denies having any medical diagnoses, reports highest level of education as high school, states that he is single, denies any past suicide attempts, denies any past hospitalizations mental health related, denies any mental health problems in his family, denies any medical problems in his family.  Pt with flat affect and depressed mood, attention to personal hygiene and grooming is fair, eye contact is good, speech is clear & coherent. Thought contents are organized and logical, and pt currently denies SI/HI/AVH or paranoia. There is no evidence of delusional thoughts.  There are no overt signs of psychosis and pt denies first rank symptoms.  [place] Suicide Risk assessment:  1. Do you wish to be dead? NO 2. Have you wished your dead or wished you could go to sleep and not wake up?  NO 3.  Have you actually had thoughts of killing yourself?   NO 4.  Have you been thinking about how you might do this?   NO 5.  Have you had these thoughts and some intention of acting on them?  NO 6.  Have you started to work out or worked out the details to kill yourself?  NO 7.  Do you intend to carry out this plan? NO 8. On a scale of 1-5 with 1 being the least severe and 5 being the most severe answer the following questions place for intensity of ideation. ZERO 9. How many times have you had these thoughts? NO 10. When you have the thoughts how long to the last?  NO 11. Control ability.  Could you or can you stop thinking about killing herself or wanting to die if you want to?  YES 12. Are there any things anyone or anything family religion pain of death that stop you from wanting to die or acting on thoughts of committing suicide?  FAMILY 13.  What sort of reason to do have to think about wanting to die or killing yourself? NONE  14. Have you done anything, started to do  anything,  or prepared to do anything to end your life? Examples: Took pills, tried to shoot yourself, cut yourself, tried to hang yourself,  took out pills but didn't swallow any, held a gun but changed your mind or it was  grabbed from your hand, went to the roof but didn't jump, collected pills, obtained  a gun, gave away valuables, wrote a will or suicide note, etc. NO.  Suicide Risk: Minimal: No identifiable suicidal ideation.  Patients presenting with no risk factors but with morbid ruminations; may be classified as minimal risk based on the severity of the depressive symptoms.   PHQ 2-9:  Flowsheet Row ED from 02/22/2024 in [facility] Most recent reading at 02/22/2024  7:32 PM ED from 06/30/2023 in [facility] Most recent reading at 07/01/2023  8:08 AM ED from 06/30/2023 in [facility] Most recent reading at 06/30/2023 10:47 PM  Thoughts that you would be better off dead, or of  hurting yourself in some way -- Not at all Not at all  PHQ-9 Total Score 16 11 11     Flowsheet Row ED from 02/22/2024 in [facility] Most recent reading at 02/22/2024 10:41 AM ED from 02/22/2024 in [facility] Most recent reading at 02/22/2024  8:12 AM ED from 02/21/2024 in [facility] Emergency Department at [facility] Most recent reading at 02/21/2024  8:17 PM  C-SSRS RISK CATEGORY No Risk No Risk No Risk     Total Time spent with patient: 1.5 hours  Musculoskeletal  Strength & Muscle Tone: within normal limits Gait & Station: normal Patient leans: N/A  Psychiatric Specialty Exam  Presentation General Appearance:  Fairly Groomed  Eye Contact: Fair  Speech: Clear and Coherent  Speech Volume: Normal  Handedness: Right   Mood and Affect  Mood: Depressed; Anxious  Affect: Congruent   Thought Process  Thought  Processes: Coherent  Descriptions of Associations:Intact  Orientation:Full (Time, Place and Person)  Thought Content:Logical  Diagnosis of Schizophrenia or Schizoaffective disorder in past: No   Hallucinations:Hallucinations: None  Ideas of Reference:None  Suicidal Thoughts:Suicidal Thoughts: No  Homicidal Thoughts:Homicidal Thoughts: No   Sensorium  Memory: Immediate Fair  Judgment: Fair  Insight: Fair   Art therapist  Concentration: Fair  Attention Span: Fair  Recall: Fair  Fund of Knowledge: Fair  Language: Fair   Psychomotor Activity  Psychomotor Activity:Psychomotor Activity: Normal   Assets  Assets: Manufacturing systems engineer; Resilience   Sleep  Sleep:Sleep: Fair   Nutritional Assessment (For OBS and FBC admissions only) Has the patient had a weight loss or gain of 10 pounds or more in the last 3 months?: Yes Has the patient had a decrease in food intake/or appetite?: Yes Does the patient have dental problems?: No Does the patient have eating habits or behaviors that may be indicators of an eating disorder including binging or inducing vomiting?: No Has the patient recently lost weight without trying?: 0 Has the patient been eating poorly because of a decreased appetite?: 0 Malnutrition Screening Tool Score: 0   Physical Exam Vitals and nursing note reviewed.  HENT:     Head: Normocephalic.  Musculoskeletal:        General: Normal range of motion.  Neurological:     General: No focal deficit present.     Mental Status: He is oriented to person, place, and time.    Review of Systems  Psychiatric/Behavioral:  Positive for depression and substance abuse. Negative for hallucinations, memory loss and suicidal ideas. The patient is nervous/anxious and has insomnia.   All other systems reviewed and are negative.   Blood pressure 122/80, pulse (!) 109, temperature 98.6 F (37 C), temperature source Oral, resp. rate 18, SpO2 95%.  There is no height or weight on file to calculate BMI.  Past Psychiatric History: MDD, GAD, bipolar d/o  Is the patient at risk to self? Yes  Has the patient been a risk to self in the past 6 months? Yes .    Has the patient been a risk to self within the distant past? No   Is the patient a risk to others? No   Has the patient been a risk to others in the past 6 months? No   Has the patient been a risk to others within the distant past? No   Past Medical History: Denies  Last Labs:  Admission on 02/22/2024, Discharged on 02/22/2024  Component Date Value Ref Range Status  WBC 02/22/2024 10.5  4.0 - 10.5 K/uL Final   RBC 02/22/2024 4.58  4.22 - 5.81 MIL/uL Final   Hemoglobin 02/22/2024 13.0  13.0 - 17.0 g/dL Final   HCT 91/87/7974 39.1  39.0 - 52.0 % Final   MCV 02/22/2024 85.4  80.0 - 100.0 fL Final   MCH 02/22/2024 28.4  26.0 - 34.0 pg Final   MCHC 02/22/2024 33.2  30.0 - 36.0 g/dL Final   RDW 91/87/7974 12.9  11.5 - 15.5 % Final   Platelets 02/22/2024 290  150 - 400 K/uL Final   nRBC 02/22/2024 0.0  0.0 - 0.2 % Final   Neutrophils Relative % 02/22/2024 69  % Final   Neutro Abs 02/22/2024 7.2  1.7 - 7.7 K/uL Final   Lymphocytes Relative 02/22/2024 18  % Final   Lymphs Abs 02/22/2024 1.8  0.7 - 4.0 K/uL Final   Monocytes Relative 02/22/2024 10  % Final   Monocytes Absolute 02/22/2024 1.1 (H)  0.1 - 1.0 K/uL Final   Eosinophils Relative 02/22/2024 2  % Final   Eosinophils Absolute 02/22/2024 0.2  0.0 - 0.5 K/uL Final   Basophils Relative 02/22/2024 0  % Final   Basophils Absolute 02/22/2024 0.0  0.0 - 0.1 K/uL Final   Immature Granulocytes 02/22/2024 1  % Final   Abs Immature Granulocytes 02/22/2024 0.05  0.00 - 0.07 K/uL Final   Performed at [facility], [address], [place], [place] [address]   Sodium 02/22/2024 141  135 - 145 mmol/L Final   Potassium 02/22/2024 3.5  3.5 - 5.1 mmol/L Final   Chloride 02/22/2024 103  98 - 111 mmol/L Final   CO2 02/22/2024 26  22 -  32 mmol/L Final   Glucose, Bld 02/22/2024 101 (H)  70 - 99 mg/dL Final   Glucose reference range applies only to samples taken after fasting for at least 8 hours.   BUN 02/22/2024 13  6 - 20 mg/dL Final   Creatinine, Ser 02/22/2024 1.08  0.61 - 1.24 mg/dL Final   Calcium 91/87/7974 9.0  8.9 - 10.3 mg/dL Final   Total Protein 91/87/7974 6.7  6.5 - 8.1 g/dL Final   Albumin 91/87/7974 3.9  3.5 - 5.0 g/dL Final   AST 91/87/7974 53 (H)  15 - 41 U/L Final   ALT 02/22/2024 65 (H)  0 - 44 U/L Final   Alkaline Phosphatase 02/22/2024 41  38 - 126 U/L Final   Total Bilirubin 02/22/2024 0.9  0.0 - 1.2 mg/dL Final   GFR, Estimated 02/22/2024 >60  >60 mL/min Final   Comment: (NOTE) Calculated using the CKD-EPI Creatinine Equation (2021)    Anion gap 02/22/2024 12  5 - 15 Final   Performed at [facility], [address] [place], [place] [address]   Alcohol, Ethyl (B) 02/22/2024 <15  <15 mg/dL Final   Comment: (NOTE) For medical purposes only. Performed at [facility], [address], [place], [place] [address]    Magnesium  02/22/2024 2.1  1.7 - 2.4 mg/dL Final   Performed at [facility], [address], [place], [place] [address]   Cholesterol 02/22/2024 134  0 - 200 mg/dL Final   Triglycerides 91/87/7974 213 (H)  <150 mg/dL Final   HDL 91/87/7974 46  >40 mg/dL Final   Total CHOL/HDL Ratio 02/22/2024 2.9  RATIO Final   VLDL 02/22/2024 43 (H)  0 - 40 mg/dL Final   LDL Cholesterol 02/22/2024 45  0 -  99 mg/dL Final   Comment:        Total Cholesterol/HDL:CHD Risk Coronary Heart Disease Risk Table                     Men   Women  1/2 Average Risk   3.4   3.3  Average Risk       5.0   4.4  2 X Average Risk   9.6   7.1  3 X Average Risk  23.4   11.0        Use the calculated Patient Ratio above and the CHD Risk Table to determine the patient's CHD Risk.        ATP III CLASSIFICATION (LDL):  <100     mg/dL   Optimal  899-870  mg/dL   Near or Above                     Optimal  130-159  mg/dL   Borderline  839-810  mg/dL   High  >809     mg/dL   Very High Performed at [facility], [address] [place], [place] [address]    TSH 02/22/2024 2.674  0.350 - 4.500 uIU/mL Final   Comment: Performed by a 3rd Generation assay with a functional sensitivity of <=0.01 uIU/mL. Performed at [facility], [address] [place], [place] [address]    Color, Urine 02/22/2024 YELLOW  YELLOW Final   APPearance 02/22/2024 CLEAR  CLEAR Final   Specific Gravity, Urine 02/22/2024 1.014  1.005 - 1.030 Final   pH 02/22/2024 5.0  5.0 - 8.0 Final   Glucose, UA 02/22/2024 NEGATIVE  NEGATIVE mg/dL Final   Hgb urine dipstick 02/22/2024 NEGATIVE  NEGATIVE Final   Bilirubin Urine 02/22/2024 NEGATIVE  NEGATIVE Final   Ketones, ur 02/22/2024 5 (A)  NEGATIVE mg/dL Final   Protein, ur 91/87/7974 30 (A)  NEGATIVE mg/dL Final   Nitrite 91/87/7974 NEGATIVE  NEGATIVE Final   Leukocytes,Ua 02/22/2024 NEGATIVE  NEGATIVE Final   RBC / HPF 02/22/2024 0-5  0 - 5 RBC/hpf Final   WBC, UA 02/22/2024 0-5  0 - 5 WBC/hpf Final   Bacteria, UA 02/22/2024 NONE SEEN  NONE SEEN Final   Squamous Epithelial / HPF 02/22/2024 0-5  0 - 5 /HPF Final   Mucus 02/22/2024 PRESENT   Final   Performed at [facility], [address], [place], [place] [address]   POC Amphetamine UR 02/22/2024 None Detected  NONE DETECTED (Cut Off Level 1000 ng/mL) Final   POC Secobarbital (BAR) 02/22/2024 None Detected  NONE DETECTED (Cut Off Level 300 ng/mL) Final   POC Buprenorphine (BUP) 02/22/2024 None Detected  NONE DETECTED (Cut Off Level 10 ng/mL) Final   POC Oxazepam (BZO) 02/22/2024 None Detected  NONE DETECTED (Cut Off Level 300 ng/mL) Final   POC Cocaine UR 02/22/2024 Positive (A)  NONE DETECTED (Cut Off Level 300 ng/mL) Final   POC Methamphetamine UR 02/22/2024 None Detected  NONE DETECTED (Cut Off Level 1000 ng/mL) Final   POC Morphine  02/22/2024 None Detected  NONE DETECTED (Cut Off Level 300  ng/mL) Final   POC Methadone UR 02/22/2024 None Detected  NONE DETECTED (Cut Off Level 300 ng/mL) Final   POC Oxycodone  UR 02/22/2024 None Detected  NONE DETECTED (Cut Off Level 100 ng/mL) Final   POC Marijuana UR 02/22/2024 Positive (A)  NONE DETECTED (Cut Off Level 50 ng/mL) Final  Admission on 02/21/2024, Discharged on 02/22/2024  Component Date Value Ref Range Status   Sodium 02/21/2024 140  135 - 145 mmol/L Final   Potassium 02/21/2024 3.8  3.5 - 5.1 mmol/L Final   Chloride 02/21/2024 103  98 - 111 mmol/L Final   CO2 02/21/2024 21 (L)  22 - 32 mmol/L Final   Glucose, Bld 02/21/2024 111 (H)  70 - 99 mg/dL Final   Glucose reference range applies only to samples taken after fasting for at least 8 hours.   BUN 02/21/2024 13  6 - 20 mg/dL Final   Creatinine, Ser 02/21/2024 1.25 (H)  0.61 - 1.24 mg/dL Final   Calcium 91/88/7974 9.2  8.9 - 10.3 mg/dL Final   GFR, Estimated 02/21/2024 >60  >60 mL/min Final   Comment: (NOTE) Calculated using the CKD-EPI Creatinine Equation (2021)    Anion gap 02/21/2024 16 (H)  5 - 15 Final   Performed at [facility], [address] [place], [place] [address]   WBC 02/21/2024 16.9 (H)  4.0 - 10.5 K/uL Final   RBC 02/21/2024 4.62  4.22 - 5.81 MIL/uL Final   Hemoglobin 02/21/2024 13.1  13.0 - 17.0 g/dL Final   HCT 91/88/7974 40.5  39.0 - 52.0 % Final   MCV 02/21/2024 87.7  80.0 - 100.0 fL Final   MCH 02/21/2024 28.4  26.0 - 34.0 pg Final   MCHC 02/21/2024 32.3  30.0 - 36.0 g/dL Final   RDW 91/88/7974 13.0  11.5 - 15.5 % Final   Platelets 02/21/2024 306  150 - 400 K/uL Final   nRBC 02/21/2024 0.0  0.0 - 0.2 % Final   Performed at [facility], [address] [place], [place] [address]   Troponin I (High Sensitivity) 02/21/2024 5  <18 ng/L Final   Comment: (NOTE) Elevated high sensitivity troponin I (hsTnI) values and significant  changes across serial measurements may suggest ACS but many other  chronic and acute conditions are known to  elevate hsTnI results.  Refer to the Links section for chest pain algorithms and additional  guidance. Performed at [facility], [address] [place], [place] [address]    Troponin I (High Sensitivity) 02/21/2024 6  <18 ng/L Final   Comment: (NOTE) Elevated high sensitivity troponin I (hsTnI) values and significant  changes across serial measurements may suggest ACS but many other  chronic and acute conditions are known to elevate hsTnI results.  Refer to the Links section for chest pain algorithms and additional  guidance. Performed at [facility], [address] [place], [place] [address]    Allergies: Patient has no known allergies.  Medications:  Facility Ordered Medications  Medication   [COMPLETED] sodium chloride  0.9 % bolus 1,000 mL   Long Term Goals: Improvement in symptoms so as ready for discharge  Short Term Goals: Patient will verbalize feelings in meetings with treatment team members., Patient will attend at least of 50% of the groups daily., Pt will complete the PHQ9 on admission, day 3 and discharge., and Patient will participate in completing the [place] Suicide Severity Rating Scale  Medical Decision Making  Referred for admission to the [facility] Started on Ativan  detox protocol-See the [facility] for details  Treatment Plan Summary: Daily contact with patient to assess and evaluate symptoms and progress in treatment and Medication management  Safety and Monitoring: Voluntary admission to inpatient psychiatric unit for safety, stabilization and treatment Daily contact with patient to assess and evaluate symptoms and progress in treatment Patient's case to  be discussed in multi-disciplinary team meeting Observation Level : q15 minute checks Vital signs: q12 hours Precautions: Safety  Long Term Goal(s): Improvement in symptoms so as ready for discharge  Short Term Goals: Ability to identify changes in lifestyle to reduce recurrence of condition  will improve, Ability to verbalize feelings will improve, Ability to disclose and discuss suicidal ideas, Ability to demonstrate self-control will improve, Ability to identify and develop effective coping behaviors will improve, Ability to maintain clinical measurements within normal limits will improve, Compliance with prescribed medications will improve, and Ability to identify triggers associated with substance abuse/mental health issues will improve   PRNS -Continue Tylenol  650 mg every 6 hours PRN for mild pain -Continue Maalox 30 mg every 4 hrs PRN for indigestion -Continue Milk of Magnesia as needed every 6 hrs for constipation  Discharge Planning: Social work and case management to assist with discharge planning and identification of hospital follow-up needs prior to discharge Estimated LOS: 5-7 days Discharge Concerns: Need to establish a safety plan; Medication compliance and effectiveness Discharge Goals: Return home with outpatient referrals for mental health follow-up including medication management/psychotherapy  I certify that inpatient services furnished can reasonably be expected to improve the patient's condition.    Donia Snell, NP 8/12/20257:39 PM

## 2024-02-22 NOTE — Discharge Instructions (Addendum)
 Your cardiac work-up today was reassuring but we do encourage you to refrain from further cocaine use.  Continued use can cause long term issues. Follow-up with your doctor. Return here for new concerns.

## 2024-02-23 DIAGNOSIS — F1020 Alcohol dependence, uncomplicated: Secondary | ICD-10-CM | POA: Diagnosis present

## 2024-02-23 DIAGNOSIS — R079 Chest pain, unspecified: Secondary | ICD-10-CM | POA: Diagnosis not present

## 2024-02-23 LAB — HEMOGLOBIN A1C
Hgb A1c MFr Bld: 6 % — ABNORMAL HIGH (ref 4.8–5.6)
Mean Plasma Glucose: 126 mg/dL

## 2024-02-23 MED ORDER — DIPHENHYDRAMINE HCL 50 MG PO CAPS: 50.0000 mg | ORAL_CAPSULE | Freq: Three times a day (TID) | ORAL | Status: DC | PRN

## 2024-02-23 MED ORDER — ALUM & MAG HYDROXIDE-SIMETH 200-200-20 MG/5ML PO SUSP: 30.0000 mL | ORAL | Status: DC | PRN

## 2024-02-23 MED ORDER — ONDANSETRON 4 MG PO TBDP: 4.0000 mg | ORAL_TABLET | Freq: Four times a day (QID) | ORAL | Status: AC | PRN

## 2024-02-23 MED ORDER — DIPHENHYDRAMINE HCL 50 MG/ML IJ SOLN
50.0000 mg | Freq: Three times a day (TID) | INTRAMUSCULAR | Status: DC | PRN
Start: 2024-02-23 — End: 2024-02-28

## 2024-02-23 MED ORDER — LOPERAMIDE HCL 2 MG PO CAPS: 2.0000 mg | ORAL_CAPSULE | ORAL | Status: AC | PRN

## 2024-02-23 MED ORDER — ADULT MULTIVITAMIN W/MINERALS CH
1.0000 | ORAL_TABLET | Freq: Every day | ORAL | Status: DC
  Administered 2024-02-23 – 2024-02-28 (×7): 1 via ORAL
  Filled 2024-02-23 (×5): qty 1

## 2024-02-23 MED ORDER — IBUPROFEN 400 MG PO TABS
400.0000 mg | ORAL_TABLET | Freq: Four times a day (QID) | ORAL | Status: DC | PRN
  Administered 2024-02-23 – 2024-02-25 (×4): 400 mg via ORAL
  Filled 2024-02-23 (×3): qty 1

## 2024-02-23 MED ORDER — NICOTINE POLACRILEX 2 MG MT GUM
2.0000 mg | CHEWING_GUM | OROMUCOSAL | Status: DC | PRN
  Administered 2024-02-23 – 2024-02-28 (×15): 2 mg via ORAL
  Filled 2024-02-23 (×15): qty 1

## 2024-02-23 MED ORDER — HYDROXYZINE HCL 25 MG PO TABS: 25.0000 mg | ORAL_TABLET | Freq: Four times a day (QID) | ORAL | Status: AC | PRN

## 2024-02-23 MED ORDER — MAGNESIUM HYDROXIDE 400 MG/5ML PO SUSP: 30.0000 mL | Freq: Every day | ORAL | Status: DC | PRN

## 2024-02-23 MED ORDER — DIPHENHYDRAMINE HCL 50 MG/ML IJ SOLN: 50.0000 mg | Freq: Three times a day (TID) | INTRAMUSCULAR | Status: DC | PRN

## 2024-02-23 MED ORDER — TRAZODONE HCL 50 MG PO TABS
50.0000 mg | ORAL_TABLET | Freq: Every evening | ORAL | Status: DC | PRN
  Administered 2024-02-23 – 2024-02-26 (×5): 50 mg via ORAL
  Filled 2024-02-23 (×6): qty 1

## 2024-02-23 MED ORDER — LORAZEPAM 1 MG PO TABS
1.0000 mg | ORAL_TABLET | Freq: Four times a day (QID) | ORAL | Status: AC | PRN
Start: 2024-02-23 — End: 2024-02-26

## 2024-02-23 MED ORDER — HALOPERIDOL LACTATE 5 MG/ML IJ SOLN: 5.0000 mg | Freq: Three times a day (TID) | INTRAMUSCULAR | Status: DC | PRN

## 2024-02-23 MED ORDER — HALOPERIDOL 5 MG PO TABS: 5.0000 mg | ORAL_TABLET | Freq: Three times a day (TID) | ORAL | Status: DC | PRN

## 2024-02-23 MED ORDER — HALOPERIDOL LACTATE 5 MG/ML IJ SOLN: 10.0000 mg | Freq: Three times a day (TID) | INTRAMUSCULAR | Status: DC | PRN

## 2024-02-23 MED ORDER — LORAZEPAM 2 MG/ML IJ SOLN: 2.0000 mg | Freq: Three times a day (TID) | INTRAMUSCULAR | Status: DC | PRN

## 2024-02-23 MED ORDER — THIAMINE MONONITRATE 100 MG PO TABS
100.0000 mg | ORAL_TABLET | Freq: Every day | ORAL | Status: DC
  Administered 2024-02-24 – 2024-02-28 (×5): 100 mg via ORAL
  Filled 2024-02-23 (×5): qty 1

## 2024-02-23 MED ORDER — ACETAMINOPHEN 325 MG PO TABS
650.0000 mg | ORAL_TABLET | Freq: Four times a day (QID) | ORAL | Status: DC | PRN
  Administered 2024-02-23 – 2024-02-28 (×7): 650 mg via ORAL
  Filled 2024-02-23 (×6): qty 2

## 2024-02-23 NOTE — ED Notes (Signed)
 Patient is sleeping. Respirations equal and unlabored. No change in assessment or acuity. Routine safety checks conducted according to facility protocol.

## 2024-02-23 NOTE — ED Notes (Signed)
 No problem observed, patient is still sleeping

## 2024-02-23 NOTE — ED Notes (Signed)
 Patient calm and cooperative with care.  Attending groups.  No evidence of withdrawal.  Pleasant and makes needs known.  Willm onitor

## 2024-02-23 NOTE — Group Note (Signed)
 Group Topic: Recovery Basics  Group Date: 02/23/2024 Start Time: 1210 End Time: 1310 Facilitators: Stanly Stabile, RN  Department: [facility]  Number of Participants: 9  Group Focus: affirmation, chemical dependency education, and chemical dependency issues Treatment Modality:  Behavior Modification Therapy Interventions utilized were clarification and patient education Purpose: enhance coping skills, explore maladaptive thinking, express feelings, express irrational fears, regain self-worth, reinforce self-care, and relapse prevention strategies  Name: Kevin Gentry Date of Birth: 1985/10/30  MR: [id number]    Level of Participation: moderate Quality of Participation: attentive and cooperative Interactions with others: gave feedback Mood/Affect: appropriate Triggers (if applicable):   Cognition: coherent/clear Progress: Gaining insight Response:   Plan: follow-up needed  Patients Problems:  Patient Active Problem List   Diagnosis Date Noted   Alcohol use disorder, severe, dependence (HCC) 02/23/2024   Substance induced mood disorder (HCC) 02/22/2024   Alcohol abuse 06/30/2023   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

## 2024-02-23 NOTE — ED Provider Notes (Signed)
 Behavioral Health Progress Note  Date and Time: 02/23/2024 11:01 AM Name: Kevin Gentry MRN:  [id number]  HPI:  Kevin Gentry is a 38 y.o. male w/ hx of MDD v ?bipolar disorder?, GAD, no psych hosp, no past suicide attempts admitted to [facility] for alcohol, cannabis, and cocaine detox.   Subjective:   Reports he is having chest pain. Reports its a stabbing pain. Reports its been going on for the past year. Denies any hx of heart issues. Reports that if he pushes on the front of chest that it reproduces the pain. Reports that it is not associated with eating or with stress, such as GERD.   Reports that he is feeling tired but it is difficult to sleep. Reports that apetite is fine. Denies n/v/d/c. Reports sweats. Denies tremors. Mild cravings for alc but none for cocaine. Reports that he is feeling down currently. Reports some anxiety around next steps. Does not want to restart previous mental health medications.   Diagnosis:  Final diagnoses:  Alcohol use disorder  Cocaine use disorder (HCC)  Tetrahydrocannabinol (THC) use disorder, severe, dependence (HCC)    Total Time spent with patient: 30 minutes  Past Psychiatric History: MDD v ?bipolar disorder?, GAD, no hosp, no SA. Previously on Depakote, Wellbutrin, Seroquel but stopped these 5 years ago due to side effects. Past Medical History: Denies any medical diagnoses Family History: No pertinent Family Psychiatric  History: Denies Social History: resides with the mother of his children, who are ages 21, 70, and 5. Reports that he currently does not work, but receives SSI income due to his bipolar disorder. his father is his emergency contact person, provided them as Ozell Bihari at [phone]. reports highest level of education as high school, states that he is single   Sleep: Fair  Appetite:  Fair  Current Medications:  No current facility-administered medications for this encounter.   No current outpatient medications on file.     Labs  Lab Results:  Admission on 02/22/2024, Discharged on 02/22/2024  Component Date Value Ref Range Status   WBC 02/22/2024 10.5  4.0 - 10.5 K/uL Final   RBC 02/22/2024 4.58  4.22 - 5.81 MIL/uL Final   Hemoglobin 02/22/2024 13.0  13.0 - 17.0 g/dL Final   HCT 91/87/7974 39.1  39.0 - 52.0 % Final   MCV 02/22/2024 85.4  80.0 - 100.0 fL Final   MCH 02/22/2024 28.4  26.0 - 34.0 pg Final   MCHC 02/22/2024 33.2  30.0 - 36.0 g/dL Final   RDW 91/87/7974 12.9  11.5 - 15.5 % Final   Platelets 02/22/2024 290  150 - 400 K/uL Final   nRBC 02/22/2024 0.0  0.0 - 0.2 % Final   Neutrophils Relative % 02/22/2024 69  % Final   Neutro Abs 02/22/2024 7.2  1.7 - 7.7 K/uL Final   Lymphocytes Relative 02/22/2024 18  % Final   Lymphs Abs 02/22/2024 1.8  0.7 - 4.0 K/uL Final   Monocytes Relative 02/22/2024 10  % Final   Monocytes Absolute 02/22/2024 1.1 (H)  0.1 - 1.0 K/uL Final   Eosinophils Relative 02/22/2024 2  % Final   Eosinophils Absolute 02/22/2024 0.2  0.0 - 0.5 K/uL Final   Basophils Relative 02/22/2024 0  % Final   Basophils Absolute 02/22/2024 0.0  0.0 - 0.1 K/uL Final   Immature Granulocytes 02/22/2024 1  % Final   Abs Immature Granulocytes 02/22/2024 0.05  0.00 - 0.07 K/uL Final   Performed at [facility],  [address] [place], [place] [address]   Sodium 02/22/2024 141  135 - 145 mmol/L Final   Potassium 02/22/2024 3.5  3.5 - 5.1 mmol/L Final   Chloride 02/22/2024 103  98 - 111 mmol/L Final   CO2 02/22/2024 26  22 - 32 mmol/L Final   Glucose, Bld 02/22/2024 101 (H)  70 - 99 mg/dL Final   Glucose reference range applies only to samples taken after fasting for at least 8 hours.   BUN 02/22/2024 13  6 - 20 mg/dL Final   Creatinine, Ser 02/22/2024 1.08  0.61 - 1.24 mg/dL Final   Calcium 91/87/7974 9.0  8.9 - 10.3 mg/dL Final   Total Protein 91/87/7974 6.7  6.5 - 8.1 g/dL Final   Albumin 91/87/7974 3.9  3.5 - 5.0 g/dL Final   AST 91/87/7974 53 (H)  15 - 41 U/L Final   ALT  02/22/2024 65 (H)  0 - 44 U/L Final   Alkaline Phosphatase 02/22/2024 41  38 - 126 U/L Final   Total Bilirubin 02/22/2024 0.9  0.0 - 1.2 mg/dL Final   GFR, Estimated 02/22/2024 >60  >60 mL/min Final   Comment: (NOTE) Calculated using the CKD-EPI Creatinine Equation (2021)    Anion gap 02/22/2024 12  5 - 15 Final   Performed at [facility], [address] [place], [place] [address]   Hgb A1c MFr Bld 02/22/2024 6.0 (H)  4.8 - 5.6 % Final   Comment: (NOTE)         Prediabetes: 5.7 - 6.4         Diabetes: >6.4         Glycemic control for adults with diabetes: <7.0    Mean Plasma Glucose 02/22/2024 126  mg/dL Final   Comment: (NOTE) Performed At: [facility] [address] [place], [place] [id number] Jennette Shorter MD Ey:[id number]    Alcohol, Ethyl (B) 02/22/2024 <15  <15 mg/dL Final   Comment: (NOTE) For medical purposes only. Performed at [facility], [address] [place], [place] [address]    Magnesium  02/22/2024 2.1  1.7 - 2.4 mg/dL Final   Performed at [facility], [address], [place], [place] [address]   Cholesterol 02/22/2024 134  0 - 200 mg/dL Final   Triglycerides 91/87/7974 213 (H)  <150 mg/dL Final   HDL 91/87/7974 46  >40 mg/dL Final   Total CHOL/HDL Ratio 02/22/2024 2.9  RATIO Final   VLDL 02/22/2024 43 (H)  0 - 40 mg/dL Final   LDL Cholesterol 02/22/2024 45  0 - 99 mg/dL Final   Comment:        Total Cholesterol/HDL:CHD Risk Coronary Heart Disease Risk Table                     Men   Women  1/2 Average Risk   3.4   3.3  Average Risk       5.0   4.4  2 X Average Risk   9.6   7.1  3 X Average Risk  23.4   11.0        Use the calculated Patient Ratio above and the CHD Risk Table to determine the patient's CHD Risk.        ATP III CLASSIFICATION (LDL):  <100     mg/dL   Optimal  899-870  mg/dL   Near or Above  Optimal  130-159  mg/dL   Borderline  839-810  mg/dL   High  >809     mg/dL   Very  High Performed at [facility], [address] [place], [place] [address]    TSH 02/22/2024 2.674  0.350 - 4.500 uIU/mL Final   Comment: Performed by a 3rd Generation assay with a functional sensitivity of <=0.01 uIU/mL. Performed at [facility], [address] [place], [place] [address]    Color, Urine 02/22/2024 YELLOW  YELLOW Final   APPearance 02/22/2024 CLEAR  CLEAR Final   Specific Gravity, Urine 02/22/2024 1.014  1.005 - 1.030 Final   pH 02/22/2024 5.0  5.0 - 8.0 Final   Glucose, UA 02/22/2024 NEGATIVE  NEGATIVE mg/dL Final   Hgb urine dipstick 02/22/2024 NEGATIVE  NEGATIVE Final   Bilirubin Urine 02/22/2024 NEGATIVE  NEGATIVE Final   Ketones, ur 02/22/2024 5 (A)  NEGATIVE mg/dL Final   Protein, ur 91/87/7974 30 (A)  NEGATIVE mg/dL Final   Nitrite 91/87/7974 NEGATIVE  NEGATIVE Final   Leukocytes,Ua 02/22/2024 NEGATIVE  NEGATIVE Final   RBC / HPF 02/22/2024 0-5  0 - 5 RBC/hpf Final   WBC, UA 02/22/2024 0-5  0 - 5 WBC/hpf Final   Bacteria, UA 02/22/2024 NONE SEEN  NONE SEEN Final   Squamous Epithelial / HPF 02/22/2024 0-5  0 - 5 /HPF Final   Mucus 02/22/2024 PRESENT   Final   Performed at [facility], [address], [place], [place] [address]   POC Amphetamine UR 02/22/2024 None Detected  NONE DETECTED (Cut Off Level 1000 ng/mL) Final   POC Secobarbital (BAR) 02/22/2024 None Detected  NONE DETECTED (Cut Off Level 300 ng/mL) Final   POC Buprenorphine (BUP) 02/22/2024 None Detected  NONE DETECTED (Cut Off Level 10 ng/mL) Final   POC Oxazepam (BZO) 02/22/2024 None Detected  NONE DETECTED (Cut Off Level 300 ng/mL) Final   POC Cocaine UR 02/22/2024 Positive (A)  NONE DETECTED (Cut Off Level 300 ng/mL) Final   POC Methamphetamine UR 02/22/2024 None Detected  NONE DETECTED (Cut Off Level 1000 ng/mL) Final   POC Morphine  02/22/2024 None Detected  NONE DETECTED (Cut Off Level 300 ng/mL) Final   POC Methadone UR 02/22/2024 None Detected  NONE DETECTED (Cut Off Level  300 ng/mL) Final   POC Oxycodone  UR 02/22/2024 None Detected  NONE DETECTED (Cut Off Level 100 ng/mL) Final   POC Marijuana UR 02/22/2024 Positive (A)  NONE DETECTED (Cut Off Level 50 ng/mL) Final  Admission on 02/21/2024, Discharged on 02/22/2024  Component Date Value Ref Range Status   Sodium 02/21/2024 140  135 - 145 mmol/L Final   Potassium 02/21/2024 3.8  3.5 - 5.1 mmol/L Final   Chloride 02/21/2024 103  98 - 111 mmol/L Final   CO2 02/21/2024 21 (L)  22 - 32 mmol/L Final   Glucose, Bld 02/21/2024 111 (H)  70 - 99 mg/dL Final   Glucose reference range applies only to samples taken after fasting for at least 8 hours.   BUN 02/21/2024 13  6 - 20 mg/dL Final   Creatinine, Ser 02/21/2024 1.25 (H)  0.61 - 1.24 mg/dL Final   Calcium 91/88/7974 9.2  8.9 - 10.3 mg/dL Final   GFR, Estimated 02/21/2024 >60  >60 mL/min Final   Comment: (NOTE) Calculated using the CKD-EPI Creatinine Equation (2021)    Anion gap 02/21/2024 16 (H)  5 - 15 Final   Performed at [facility], [address], [place], [place] [address]   WBC 02/21/2024 16.9 (H)  4.0 - 10.5 K/uL Final   RBC 02/21/2024 4.62  4.22 - 5.81 MIL/uL Final   Hemoglobin 02/21/2024 13.1  13.0 - 17.0 g/dL Final   HCT 91/88/7974 40.5  39.0 - 52.0 % Final   MCV 02/21/2024 87.7  80.0 - 100.0 fL Final   MCH 02/21/2024 28.4  26.0 - 34.0 pg Final   MCHC 02/21/2024 32.3  30.0 - 36.0 g/dL Final   RDW 91/88/7974 13.0  11.5 - 15.5 % Final   Platelets 02/21/2024 306  150 - 400 K/uL Final   nRBC 02/21/2024 0.0  0.0 - 0.2 % Final   Performed at [facility], [address] [place], [place] [address]   Troponin I (High Sensitivity) 02/21/2024 5  <18 ng/L Final   Comment: (NOTE) Elevated high sensitivity troponin I (hsTnI) values and significant  changes across serial measurements may suggest ACS but many other  chronic and acute conditions are known to elevate hsTnI results.  Refer to the Links section for chest pain algorithms and  additional  guidance. Performed at [facility], [address] [place], [place] [address]    Troponin I (High Sensitivity) 02/21/2024 6  <18 ng/L Final   Comment: (NOTE) Elevated high sensitivity troponin I (hsTnI) values and significant  changes across serial measurements may suggest ACS but many other  chronic and acute conditions are known to elevate hsTnI results.  Refer to the Links section for chest pain algorithms and additional  guidance. Performed at [facility], [address] [place], [place] [address]     Blood Alcohol level:  Lab Results  Component Value Date   [facility] <15 02/22/2024   ETH <10 06/30/2023    Metabolic Disorder Labs: Lab Results  Component Value Date   HGBA1C 6.0 (H) 02/22/2024   MPG 126 02/22/2024   No results found for: PROLACTIN Lab Results  Component Value Date   CHOL 134 02/22/2024   TRIG 213 (H) 02/22/2024   HDL 46 02/22/2024   CHOLHDL 2.9 02/22/2024   VLDL 43 (H) 02/22/2024   LDLCALC 45 02/22/2024   LDLCALC 71 04/03/2010    Therapeutic Lab Levels: No results found for: LITHIUM No results found for: VALPROATE No results found for: CBMZ  Physical Findings   PHQ2-9    Flowsheet Row ED from 02/22/2024 in [facility] Most recent reading at 02/22/2024  7:32 PM ED from 06/30/2023 in [facility] Most recent reading at 07/01/2023  8:08 AM ED from 06/30/2023 in [facility] Most recent reading at 06/30/2023 10:47 PM  PHQ-2 Total Score 4 4 4   PHQ-9 Total Score 16 11 11    Flowsheet Row ED from 02/22/2024 in [facility] Most recent reading at 02/22/2024 10:41 AM ED from 02/22/2024 in [facility] Most recent reading at 02/22/2024  8:12 AM ED from 02/21/2024 in [facility] Emergency Department at [facility] Most recent reading at 02/21/2024  8:17 PM  C-SSRS RISK CATEGORY  No Risk No Risk No Risk     Musculoskeletal  Strength & Muscle Tone: within normal limits Gait & Station: normal Patient leans: N/A  Psychiatric Specialty Exam  Presentation  General Appearance:  Fairly Groomed  Eye Contact: Fair  Speech: Clear and Coherent  Speech Volume: Normal  Handedness: Right   Mood and Affect  Mood: Depressed; Anxious  Affect:  Congruent   Thought Process  Thought Processes: Coherent  Descriptions of Associations:Intact  Orientation:Full (Time, Place and Person)  Thought Content:Logical  Diagnosis of Schizophrenia or Schizoaffective disorder in past: No    Hallucinations:Hallucinations: None  Ideas of Reference:None  Suicidal Thoughts:Suicidal Thoughts: No  Homicidal Thoughts:Homicidal Thoughts: No   Sensorium  Memory: Immediate Fair  Judgment: Fair  Insight: Fair   Art therapist  Concentration: Fair  Attention Span: Fair  Recall: [employer] of Knowledge: Fair  Language: Fair   Psychomotor Activity  Psychomotor Activity: Psychomotor Activity: Normal   Assets  Assets: Communication Skills; Resilience   Sleep  Sleep: Sleep: Fair  Estimated Sleeping Duration (Last 24 Hours): 12.75-14.50 hours  Nutritional Assessment (For OBS and FBC admissions only) Has the patient had a weight loss or gain of 10 pounds or more in the last 3 months?: Yes Has the patient had a decrease in food intake/or appetite?: Yes Does the patient have dental problems?: No Does the patient have eating habits or behaviors that may be indicators of an eating disorder including binging or inducing vomiting?: No Has the patient recently lost weight without trying?: 0 Has the patient been eating poorly because of a decreased appetite?: 0 Malnutrition Screening Tool Score: 0    Physical Exam  Physical Exam Vitals and nursing note reviewed.  Constitutional:      General: He is not in acute distress.    Appearance:  He is well-developed.  HENT:     Head: Normocephalic and atraumatic.  Pulmonary:     Effort: Pulmonary effort is normal. No respiratory distress.  Musculoskeletal:        General: No swelling.  Neurological:     General: No focal deficit present.     Mental Status: He is alert.    Review of Systems  Constitutional:  Positive for diaphoresis. Negative for fever.  Cardiovascular:  Positive for chest pain. Negative for palpitations.  Gastrointestinal:  Negative for constipation, diarrhea, nausea and vomiting.  Neurological:  Negative for dizziness, weakness and headaches.   Blood pressure 120/82, pulse 96, temperature 98.4 F (36.9 C), temperature source Oral, resp. rate 18, SpO2 100%. There is no height or weight on file to calculate BMI.  Treatment Plan Summary: Daily contact with patient to assess and evaluate symptoms and progress in treatment and Medication management  Patient reports chest pain and suspect that this is secondary to most likely from MSK/costochondritis given reproducibility to palpation per patient but could also be GERD.  Close suspicion that it is cardiac in nature as EKG and troponins were normal and patient is not having any symptoms including orthopnea, dyspnea, lower extremity edema, angina.  Patient has significant psychiatric history but does not want to restart medications.  Patient is not not harm to self or others at this time.  Plan to discuss naltrexone patient prior to discharge but was not interested today.   Safety and Monitoring: Voluntary admission to inpatient psychiatric unit for safety, stabilization and treatment Daily contact with patient to assess and evaluate symptoms and progress in treatment Patient's case to be discussed in multi-disciplinary team meeting Observation Level : q15 minute checks Vital signs: q12 hours Precautions: Safety   Long Term Goal(s): Improvement in symptoms so as ready for discharge   Short Term Goals: Ability  to identify changes in lifestyle to reduce recurrence of condition will improve, Ability to verbalize feelings will improve, Ability to disclose and discuss suicidal ideas, Ability to demonstrate self-control will improve, Ability to  identify and develop effective coping behaviors will improve, Ability to maintain clinical measurements within normal limits will improve, Compliance with prescribed medications will improve, and Ability to identify triggers associated with substance abuse/mental health issues will improve    PRNS -Continue Tylenol  650 mg every 6 hours PRN for mild pain -Continue Maalox 30 mg every 4 hrs PRN for indigestion -Continue Milk of Magnesia as needed every 6 hrs for constipation -Continue trazodone  once at bedtime for insomnia -Continue nicotine  gum as needed -Continue Ativan  1 mg for CIWA >10 -Continue ibuprofen  40 mg once every 6 hours as needed for headache and moderate pain -Continue Imodium  as needed for diarrhea -Continue hydroxy 25 mg as needed for anxiety Continue ondansetron  4 mg as needed for nausea and vomiting   Discharge Planning: Social work and case management to assist with discharge planning and identification of hospital follow-up needs prior to discharge Estimated LOS: 5-7 days Discharge Concerns: Need to establish a safety plan; Medication compliance and effectiveness Discharge Goals: Return home with outpatient referrals for mental health follow-up including medication management/psychotherapy  Justino Cornish, MD 02/23/2024 11:01 AM

## 2024-02-23 NOTE — ED Notes (Signed)
 At present patient is doing well. Denied SI/HI and AVH. He sleeps well. He eats adequate. Alert and oriented to time, place, person and situation. Hygiene maintained. Speech clear. Thought process remains within normal limit. BM remains as usual. Motor activities remains within normal limit. Med compliant. No issue at this moment.

## 2024-02-23 NOTE — ED Notes (Signed)
 Patient was provided dinner

## 2024-02-23 NOTE — Group Note (Signed)
 Group Topic: Recovery Basics  Group Date: 02/23/2024 Start Time: 1210 End Time: 1310 Facilitators: Stanly Stabile, RN  Department: [facility]  Number of Participants: 9  Group Focus: affirmation, chemical dependency education, and chemical dependency issues Treatment Modality:  Behavior Modification Therapy Interventions utilized were clarification and patient education Purpose: enhance coping skills, explore maladaptive thinking, express feelings, express irrational fears, regain self-worth, reinforce self-care, and relapse prevention strategies  Name: DANIELLE LENTO Date of Birth: [birth year]  MR: [id number]    Level of Participation: moderate Quality of Participation: attentive and cooperative Interactions with others: gave feedback Mood/Affect: appropriate Triggers (if applicable):   Cognition: coherent/clear Progress: Gaining insight Response:   Plan: follow-up needed  Patients Problems:  Patient Active Problem List   Diagnosis Date Noted   Alcohol use disorder, severe, dependence (HCC) 02/23/2024   Substance induced mood disorder (HCC) 02/22/2024   Alcohol abuse 06/30/2023   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

## 2024-02-23 NOTE — Group Note (Signed)
 Group Topic: Relapse and Recovery  Group Date: 02/23/2024 Start Time: 2000 End Time: 2100 Facilitators: Joan Plowman B  Department: [facility]  Number of Participants: 8  Group Focus: abuse issues and daily focus Treatment Modality:  Leisure Development Interventions utilized were patient education, story telling, and support Purpose: express feelings  Name: Kevin Gentry Date of Birth: [birth year]  MR: [id number]    Level of Participation: active Quality of Participation: cooperative Interactions with others: gave feedback Mood/Affect: appropriate Triggers (if applicable): NA Cognition: coherent/clear Progress: Gaining insight Response: NA Plan: patient will be encouraged to keep going to groups  Patients Problems:  Patient Active Problem List   Diagnosis Date Noted   Alcohol use disorder, severe, dependence (HCC) 02/23/2024   Substance induced mood disorder (HCC) 02/22/2024   Alcohol abuse 06/30/2023   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

## 2024-02-23 NOTE — ED Notes (Signed)
 At this time patient is sleeping nothing strange noted

## 2024-02-24 DIAGNOSIS — R079 Chest pain, unspecified: Secondary | ICD-10-CM | POA: Diagnosis not present

## 2024-02-24 MED ORDER — LAMOTRIGINE 25 MG PO TABS
25.0000 mg | ORAL_TABLET | Freq: Every day | ORAL | Status: DC
  Administered 2024-02-24 – 2024-02-28 (×5): 25 mg via ORAL
  Filled 2024-02-24 (×3): qty 1
  Filled 2024-02-24: qty 7
  Filled 2024-02-24 (×2): qty 1
  Filled 2024-02-24: qty 7

## 2024-02-24 NOTE — ED Notes (Signed)
 Pt educational handout r/t new medication Lamictal  provided to pt.

## 2024-02-24 NOTE — Group Note (Signed)
 Group Topic: Fears and Unhealthy Coping Skills  Group Date: 02/24/2024 Start Time: 0800 End Time: 0900 Facilitators: Lonzell Dwayne RAMAN, NT  Department: [facility]  Number of Participants: 8  Group Focus: chemical dependency education Treatment Modality:  Cognitive Behavioral Therapy Interventions utilized were patient education Purpose: reinforce self-care  Name: Kevin Gentry Date of Birth: May 15, 1986  MR: [id number]    Level of Participation: Patient attended groupmoderate Quality of Participation: attentive Interactions with others: gave feedback Mood/Affect: positive Triggers (if applicable): N/A Cognition: concrete Progress: Moderate Response: Appropriate  Plan: patient will be encouraged to work on himself, he has a good attitude and wants to get better.  Patients Problems:  Patient Active Problem List   Diagnosis Date Noted   Alcohol use disorder, severe, dependence (HCC) 02/23/2024   Substance induced mood disorder (HCC) 02/22/2024   Alcohol abuse 06/30/2023   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

## 2024-02-24 NOTE — ED Notes (Signed)
 Pt administered prn IBU for c/o 8/10 level headache. RN encouraged hydration. Pt affimrs to have eaten breakfast, says he feels 'alright', RN observes somewhat depressed affect, pt is generally polite and cooperative. Pt administered prn nicotine  gum as requested, RN provided education about 'chew and park' or improved benefit, pt expressed understanding.

## 2024-02-24 NOTE — ED Notes (Signed)
 Pt is sleeping, no acute distress noted.

## 2024-02-24 NOTE — ED Provider Notes (Addendum)
 Behavioral Health Progress Note  Date and Time: 02/24/2024 1:46 PM Name: Kevin Gentry MRN:  [id number]  Subjective:  Zyhir was seen in his room on rounds today. He feels alright overall. He is sleeping and eating okay. He has had a bit of a headache since waking up but its starting to resolve with the ibuprofen . He has no other physical complaints or questions. He has not heard about any placement offers so far. He denies hallucinations, thoughts of harm to self or others.   Diagnosis:  Final diagnoses:  Alcohol use disorder  Cocaine use disorder (HCC)  Tetrahydrocannabinol (THC) use disorder, severe, dependence (HCC)    Total Time spent with patient: 15 minutes  Past Psychiatric History: MDD v ?bipolar disorder?, GAD, no hosp, no SA. Previously on Depakote, Wellbutrin, Seroquel but stopped these 5 years ago due to side effects. Past Medical History: Denies any medical diagnoses Family History: No pertinent Family Psychiatric  History: Denies Social History: resides with the mother of his children, who are ages 76, 29, and 5. Reports that he currently does not work, but receives SSI income due to his bipolar disorder. his father is his emergency contact person, provided them as Ozell Bihari at [phone]. reports highest level of education as high school, states that he is single           Sleep: Good  Appetite:  Good  Current Medications:  Current Facility-Administered Medications  Medication Dose Route Frequency Provider Last Rate Last Admin   acetaminophen  (TYLENOL ) tablet 650 mg  650 mg Oral Q6H PRN McCarty, Artie, MD   650 mg at 02/23/24 1312   alum & mag hydroxide-simeth (MAALOX/MYLANTA) 200-200-20 MG/5ML suspension 30 mL  30 mL Oral Q4H PRN McCarty, Artie, MD       haloperidol  (HALDOL ) tablet 5 mg  5 mg Oral TID PRN McCarty, Artie, MD       And   diphenhydrAMINE  (BENADRYL ) capsule 50 mg  50 mg Oral TID PRN McCarty, Artie, MD       haloperidol  lactate (HALDOL )  injection 5 mg  5 mg Intramuscular TID PRN McCarty, Artie, MD       And   diphenhydrAMINE  (BENADRYL ) injection 50 mg  50 mg Intramuscular TID PRN McCarty, Artie, MD       And   LORazepam  (ATIVAN ) injection 2 mg  2 mg Intramuscular TID PRN McCarty, Artie, MD       haloperidol  lactate (HALDOL ) injection 10 mg  10 mg Intramuscular TID PRN McCarty, Artie, MD       And   diphenhydrAMINE  (BENADRYL ) injection 50 mg  50 mg Intramuscular TID PRN McCarty, Artie, MD       And   LORazepam  (ATIVAN ) injection 2 mg  2 mg Intramuscular TID PRN McCarty, Artie, MD       hydrOXYzine  (ATARAX ) tablet 25 mg  25 mg Oral Q6H PRN McCarty, Artie, MD       ibuprofen  (ADVIL ) tablet 400 mg  400 mg Oral Q6H PRN Cole Kandi BROCKS, MD   400 mg at 02/24/24 0908   loperamide  (IMODIUM ) capsule 2-4 mg  2-4 mg Oral PRN McCarty, Artie, MD       LORazepam  (ATIVAN ) tablet 1 mg  1 mg Oral Q6H PRN McCarty, Artie, MD       magnesium  hydroxide (MILK OF MAGNESIA) suspension 30 mL  30 mL Oral Daily PRN McCarty, Artie, MD       multivitamin with minerals tablet 1 tablet  1 tablet Oral  Daily McCarty, Artie, MD   1 tablet at 02/24/24 9091   nicotine  polacrilex (NICORETTE ) gum 2 mg  2 mg Oral PRN McCarty, Artie, MD   2 mg at 02/23/24 2109   ondansetron  (ZOFRAN -ODT) disintegrating tablet 4 mg  4 mg Oral Q6H PRN McCarty, Artie, MD       thiamine  (VITAMIN B1) tablet 100 mg  100 mg Oral Daily McCarty, Artie, MD   100 mg at 02/24/24 9091   traZODone  (DESYREL ) tablet 50 mg  50 mg Oral QHS PRN McCarty, Artie, MD   50 mg at 02/23/24 2109   No current outpatient medications on file.    Labs  Lab Results:  Admission on 02/22/2024, Discharged on 02/22/2024  Component Date Value Ref Range Status   WBC 02/22/2024 10.5  4.0 - 10.5 K/uL Final   RBC 02/22/2024 4.58  4.22 - 5.81 MIL/uL Final   Hemoglobin 02/22/2024 13.0  13.0 - 17.0 g/dL Final   HCT 91/87/7974 39.1  39.0 - 52.0 % Final   MCV 02/22/2024 85.4  80.0 - 100.0 fL Final   MCH 02/22/2024  28.4  26.0 - 34.0 pg Final   MCHC 02/22/2024 33.2  30.0 - 36.0 g/dL Final   RDW 91/87/7974 12.9  11.5 - 15.5 % Final   Platelets 02/22/2024 290  150 - 400 K/uL Final   nRBC 02/22/2024 0.0  0.0 - 0.2 % Final   Neutrophils Relative % 02/22/2024 69  % Final   Neutro Abs 02/22/2024 7.2  1.7 - 7.7 K/uL Final   Lymphocytes Relative 02/22/2024 18  % Final   Lymphs Abs 02/22/2024 1.8  0.7 - 4.0 K/uL Final   Monocytes Relative 02/22/2024 10  % Final   Monocytes Absolute 02/22/2024 1.1 (H)  0.1 - 1.0 K/uL Final   Eosinophils Relative 02/22/2024 2  % Final   Eosinophils Absolute 02/22/2024 0.2  0.0 - 0.5 K/uL Final   Basophils Relative 02/22/2024 0  % Final   Basophils Absolute 02/22/2024 0.0  0.0 - 0.1 K/uL Final   Immature Granulocytes 02/22/2024 1  % Final   Abs Immature Granulocytes 02/22/2024 0.05  0.00 - 0.07 K/uL Final   Performed at [facility], [address], [place], [place] [address]   Sodium 02/22/2024 141  135 - 145 mmol/L Final   Potassium 02/22/2024 3.5  3.5 - 5.1 mmol/L Final   Chloride 02/22/2024 103  98 - 111 mmol/L Final   CO2 02/22/2024 26  22 - 32 mmol/L Final   Glucose, Bld 02/22/2024 101 (H)  70 - 99 mg/dL Final   Glucose reference range applies only to samples taken after fasting for at least 8 hours.   BUN 02/22/2024 13  6 - 20 mg/dL Final   Creatinine, Ser 02/22/2024 1.08  0.61 - 1.24 mg/dL Final   Calcium 91/87/7974 9.0  8.9 - 10.3 mg/dL Final   Total Protein 91/87/7974 6.7  6.5 - 8.1 g/dL Final   Albumin 91/87/7974 3.9  3.5 - 5.0 g/dL Final   AST 91/87/7974 53 (H)  15 - 41 U/L Final   ALT 02/22/2024 65 (H)  0 - 44 U/L Final   Alkaline Phosphatase 02/22/2024 41  38 - 126 U/L Final   Total Bilirubin 02/22/2024 0.9  0.0 - 1.2 mg/dL Final   GFR, Estimated 02/22/2024 >60  >60 mL/min Final   Comment: (NOTE) Calculated using the CKD-EPI Creatinine Equation (2021)    Anion gap 02/22/2024 12  5 - 15 Final   Performed  at [facility], [address]  [place], [place] [address]   Hgb A1c MFr Bld 02/22/2024 6.0 (H)  4.8 - 5.6 % Final   Comment: (NOTE)         Prediabetes: 5.7 - 6.4         Diabetes: >6.4         Glycemic control for adults with diabetes: <7.0    Mean Plasma Glucose 02/22/2024 126  mg/dL Final   Comment: (NOTE) Performed At: [facility] [address] [place], [place] [id number] Jennette Shorter MD Ey:[id number]    Alcohol, Ethyl (B) 02/22/2024 <15  <15 mg/dL Final   Comment: (NOTE) For medical purposes only. Performed at [facility], [address] [place], [place] [address]    Magnesium  02/22/2024 2.1  1.7 - 2.4 mg/dL Final   Performed at [facility], [address], [place], [place] [address]   Cholesterol 02/22/2024 134  0 - 200 mg/dL Final   Triglycerides 91/87/7974 213 (H)  <150 mg/dL Final   HDL 91/87/7974 46  >40 mg/dL Final   Total CHOL/HDL Ratio 02/22/2024 2.9  RATIO Final   VLDL 02/22/2024 43 (H)  0 - 40 mg/dL Final   LDL Cholesterol 02/22/2024 45  0 - 99 mg/dL Final   Comment:        Total Cholesterol/HDL:CHD Risk Coronary Heart Disease Risk Table                     Men   Women  1/2 Average Risk   3.4   3.3  Average Risk       5.0   4.4  2 X Average Risk   9.6   7.1  3 X Average Risk  23.4   11.0        Use the calculated Patient Ratio above and the CHD Risk Table to determine the patient's CHD Risk.        ATP III CLASSIFICATION (LDL):  <100     mg/dL   Optimal  899-870  mg/dL   Near or Above                    Optimal  130-159  mg/dL   Borderline  839-810  mg/dL   High  >809     mg/dL   Very High Performed at [facility], [address], [place], [place] [address]    TSH 02/22/2024 2.674  0.350 - 4.500 uIU/mL Final   Comment: Performed by a 3rd Generation assay with a functional sensitivity of <=0.01 uIU/mL. Performed at [facility], [address] [place], [place] [address]    Color, Urine 02/22/2024 YELLOW  YELLOW Final   APPearance 02/22/2024  CLEAR  CLEAR Final   Specific Gravity, Urine 02/22/2024 1.014  1.005 - 1.030 Final   pH 02/22/2024 5.0  5.0 - 8.0 Final   Glucose, UA 02/22/2024 NEGATIVE  NEGATIVE mg/dL Final   Hgb urine dipstick 02/22/2024 NEGATIVE  NEGATIVE Final   Bilirubin Urine 02/22/2024 NEGATIVE  NEGATIVE Final   Ketones, ur 02/22/2024 5 (A)  NEGATIVE mg/dL Final   Protein, ur 91/87/7974 30 (A)  NEGATIVE mg/dL Final   Nitrite 91/87/7974 NEGATIVE  NEGATIVE Final   Leukocytes,Ua 02/22/2024 NEGATIVE  NEGATIVE Final   RBC / HPF 02/22/2024 0-5  0 - 5 RBC/hpf Final   WBC, UA 02/22/2024 0-5  0 - 5 WBC/hpf Final   Bacteria, UA 02/22/2024  NONE SEEN  NONE SEEN Final   Squamous Epithelial / HPF 02/22/2024 0-5  0 - 5 /HPF Final   Mucus 02/22/2024 PRESENT   Final   Performed at [facility], [address] [place], [place] [address]   POC Amphetamine UR 02/22/2024 None Detected  NONE DETECTED (Cut Off Level 1000 ng/mL) Final   POC Secobarbital (BAR) 02/22/2024 None Detected  NONE DETECTED (Cut Off Level 300 ng/mL) Final   POC Buprenorphine (BUP) 02/22/2024 None Detected  NONE DETECTED (Cut Off Level 10 ng/mL) Final   POC Oxazepam (BZO) 02/22/2024 None Detected  NONE DETECTED (Cut Off Level 300 ng/mL) Final   POC Cocaine UR 02/22/2024 Positive (A)  NONE DETECTED (Cut Off Level 300 ng/mL) Final   POC Methamphetamine UR 02/22/2024 None Detected  NONE DETECTED (Cut Off Level 1000 ng/mL) Final   POC Morphine  02/22/2024 None Detected  NONE DETECTED (Cut Off Level 300 ng/mL) Final   POC Methadone UR 02/22/2024 None Detected  NONE DETECTED (Cut Off Level 300 ng/mL) Final   POC Oxycodone  UR 02/22/2024 None Detected  NONE DETECTED (Cut Off Level 100 ng/mL) Final   POC Marijuana UR 02/22/2024 Positive (A)  NONE DETECTED (Cut Off Level 50 ng/mL) Final  Admission on 02/21/2024, Discharged on 02/22/2024  Component Date Value Ref Range Status   Sodium 02/21/2024 140  135 - 145 mmol/L Final   Potassium 02/21/2024 3.8  3.5 - 5.1 mmol/L  Final   Chloride 02/21/2024 103  98 - 111 mmol/L Final   CO2 02/21/2024 21 (L)  22 - 32 mmol/L Final   Glucose, Bld 02/21/2024 111 (H)  70 - 99 mg/dL Final   Glucose reference range applies only to samples taken after fasting for at least 8 hours.   BUN 02/21/2024 13  6 - 20 mg/dL Final   Creatinine, Ser 02/21/2024 1.25 (H)  0.61 - 1.24 mg/dL Final   Calcium 91/88/7974 9.2  8.9 - 10.3 mg/dL Final   GFR, Estimated 02/21/2024 >60  >60 mL/min Final   Comment: (NOTE) Calculated using the CKD-EPI Creatinine Equation (2021)    Anion gap 02/21/2024 16 (H)  5 - 15 Final   Performed at [facility], [address] [place], [place] [address]   WBC 02/21/2024 16.9 (H)  4.0 - 10.5 K/uL Final   RBC 02/21/2024 4.62  4.22 - 5.81 MIL/uL Final   Hemoglobin 02/21/2024 13.1  13.0 - 17.0 g/dL Final   HCT 91/88/7974 40.5  39.0 - 52.0 % Final   MCV 02/21/2024 87.7  80.0 - 100.0 fL Final   MCH 02/21/2024 28.4  26.0 - 34.0 pg Final   MCHC 02/21/2024 32.3  30.0 - 36.0 g/dL Final   RDW 91/88/7974 13.0  11.5 - 15.5 % Final   Platelets 02/21/2024 306  150 - 400 K/uL Final   nRBC 02/21/2024 0.0  0.0 - 0.2 % Final   Performed at [facility], [address], [place], [place] [address]   Troponin I (High Sensitivity) 02/21/2024 5  <18 ng/L Final   Comment: (NOTE) Elevated high sensitivity troponin I (hsTnI) values and significant  changes across serial measurements may suggest ACS but many other  chronic and acute conditions are known to elevate hsTnI results.  Refer to the Links section for chest pain algorithms and additional  guidance. Performed at [facility], [address] [place], [place] [address]    Troponin I (High Sensitivity) 02/21/2024 6  <18 ng/L Final  Comment: (NOTE) Elevated high sensitivity troponin I (hsTnI) values and significant  changes across serial measurements may suggest ACS but many other  chronic and acute conditions are known to elevate hsTnI results.   Refer to the Links section for chest pain algorithms and additional  guidance. Performed at [facility], [address] [place], [place] [address]     Blood Alcohol level:  Lab Results  Component Value Date   [facility] <15 02/22/2024   ETH <10 06/30/2023    Metabolic Disorder Labs: Lab Results  Component Value Date   HGBA1C 6.0 (H) 02/22/2024   MPG 126 02/22/2024   No results found for: PROLACTIN Lab Results  Component Value Date   CHOL 134 02/22/2024   TRIG 213 (H) 02/22/2024   HDL 46 02/22/2024   CHOLHDL 2.9 02/22/2024   VLDL 43 (H) 02/22/2024   LDLCALC 45 02/22/2024   LDLCALC 71 04/03/2010    Therapeutic Lab Levels: No results found for: LITHIUM No results found for: VALPROATE No results found for: CBMZ  Physical Findings   PHQ2-9    Flowsheet Row ED from 02/22/2024 in [facility] Most recent reading at 02/22/2024  7:32 PM ED from 06/30/2023 in [facility] Most recent reading at 07/01/2023  8:08 AM ED from 06/30/2023 in [facility] Most recent reading at 06/30/2023 10:47 PM  PHQ-2 Total Score 4 4 4   PHQ-9 Total Score 16 11 11    Flowsheet Row ED from 02/22/2024 in [facility] Most recent reading at 02/22/2024 10:41 AM ED from 02/22/2024 in [facility] Most recent reading at 02/22/2024  8:12 AM ED from 02/21/2024 in [facility] Emergency Department at [facility] Most recent reading at 02/21/2024  8:17 PM  C-SSRS RISK CATEGORY No Risk No Risk No Risk     Musculoskeletal  Strength & Muscle Tone: within normal limits Gait & Station: normal Patient leans: N/A  Psychiatric Specialty Exam  Presentation  General Appearance:  Casual  Eye Contact: Fair  Speech: Normal Rate  Speech Volume: Normal  Handedness: Right   Mood and Affect  Mood: Euthymic  Affect: Blunt   Thought Process   Thought Processes: Linear  Descriptions of Associations:Intact  Orientation:Full (Time, Place and Person)  Thought Content:Logical  Diagnosis of Schizophrenia or Schizoaffective disorder in past: No    Hallucinations:Hallucinations: None  Ideas of Reference:None  Suicidal Thoughts:Suicidal Thoughts: No  Homicidal Thoughts:Homicidal Thoughts: No   Sensorium  Memory: Immediate Fair; Recent Fair  Judgment: Fair  Insight: Fair   Art therapist  Concentration: Fair  Attention Span: Fair  Recall: [employer] of Knowledge: Fair  Language: Good   Psychomotor Activity  Psychomotor Activity: Psychomotor Activity: Normal   Assets  Assets: Communication Skills; Desire for Improvement   Sleep  Sleep: Sleep: Good  Estimated Sleeping Duration (Last 24 Hours): 9.50-11.50 hours  No data recorded  Physical Exam  Physical Exam Vitals and nursing note reviewed.  HENT:     Head: Normocephalic and atraumatic.  Eyes:     Extraocular Movements: Extraocular movements intact.  Pulmonary:     Effort: Pulmonary effort is normal.  Musculoskeletal:        General: Normal range of motion.     Cervical back: Normal range of motion.  Neurological:     General: No focal deficit present.     Mental Status: He is oriented to person, place, and time.  Psychiatric:  Behavior: Behavior normal.    Review of Systems  Constitutional:  Negative for fever.  Cardiovascular:  Negative for chest pain.  Gastrointestinal:  Negative for constipation, diarrhea, nausea and vomiting.  Musculoskeletal:  Negative for joint pain and myalgias.  Neurological:  Positive for headaches. Negative for dizziness.  Psychiatric/Behavioral:  Negative for hallucinations and suicidal ideas.    Blood pressure 121/81, pulse 99, temperature 98.2 F (36.8 C), temperature source Oral, resp. rate 18, SpO2 100%. There is no height or weight on file to calculate BMI.  Treatment Plan  Summary: Daily contact with patient to assess and evaluate symptoms and progress in treatment and Medication management   Patient has significant psychiatric history but does not want to restart medications.  Patient is not not harm to self or others at this time.     Safety and Monitoring: Voluntary admission to inpatient psychiatric unit for safety, stabilization and treatment Daily contact with patient to assess and evaluate symptoms and progress in treatment Patient's case to be discussed in multi-disciplinary team meeting Observation Level : q15 minute checks Vital signs: q12 hours Precautions: Safety   Long Term Goal(s): Improvement in symptoms so as ready for discharge   Short Term Goals: Ability to identify changes in lifestyle to reduce recurrence of condition will improve, Ability to verbalize feelings will improve, Ability to disclose and discuss suicidal ideas, Ability to demonstrate self-control will improve, Ability to identify and develop effective coping behaviors will improve, Ability to maintain clinical measurements within normal limits will improve, Compliance with prescribed medications will improve, and Ability to identify triggers associated with substance abuse/mental health issues will improve  Psychiatric medications: -started lamictal  25mg  PO daily for bipolar disorder    PRNS -Continue Tylenol  650 mg every 6 hours PRN for mild pain -Continue Maalox 30 mg every 4 hrs PRN for indigestion -Continue Milk of Magnesia as needed every 6 hrs for constipation -Continue trazodone  once at bedtime for insomnia -Continue nicotine  gum as needed -Continue Ativan  1 mg for CIWA >10 -Continue ibuprofen  40 mg once every 6 hours as needed for headache and moderate pain -Continue Imodium  as needed for diarrhea -Continue hydroxy 25 mg as needed for anxiety Continue ondansetron  4 mg as needed for nausea and vomiting   Discharge Planning: Social work and case management to assist  with discharge planning and identification of hospital follow-up needs prior to discharge Estimated LOS: 5-7 days Discharge Concerns: Need to establish a safety plan; Medication compliance and effectiveness Discharge Goals: Return home with outpatient referrals for mental health follow-up including medication management/psychotherapy  Corean Anette Potters, MD 02/24/2024 1:46 PM

## 2024-02-24 NOTE — ED Notes (Signed)
 Pt displays general 'gentle' demeanor, cooperative however appears to be somewhat depressed. Pt interacts well with staff and other pts. Pt currently socializing with another pt in dayroom. No distress observed. Pt ate lunch.

## 2024-02-24 NOTE — Group Note (Signed)
 Group Topic: Identity and Relationships  Group Date: 02/24/2024 Start Time: 1710 End Time: 1735 Facilitators: Daved Tinnie HERO, RN  Department: [facility]  Number of Participants: 6  Group Focus: family, healthy friendships, and nursing group Treatment Modality:  Psychoeducation Interventions utilized were patient education and story telling Purpose: increase insight  Name: Kevin Gentry Date of Birth: Sep 07, 1985  MR: [id number]    Level of Participation: active Quality of Participation: attentive and cooperative Interactions with others: gave feedback Mood/Affect: appropriate Triggers (if applicable): n/a Cognition: coherent/clear Progress: Significant Response: to maintain and attract healthy relationships, pt says they will honor red flags when he sees them Plan: patient will be encouraged to attend future RN education groups  Patients Problems:  Patient Active Problem List   Diagnosis Date Noted   Alcohol use disorder, severe, dependence (HCC) 02/23/2024   Substance induced mood disorder (HCC) 02/22/2024   Alcohol abuse 06/30/2023   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

## 2024-02-24 NOTE — ED Notes (Signed)
 Patient asleep in the bedroom. NAD Will monitor for safety.

## 2024-02-24 NOTE — Group Note (Signed)
 Group Topic: Recovery Basics  Group Date: 02/24/2024 Start Time: 2100 End Time: 2130 Facilitators: Joan Plowman B  Department: [facility]  Number of Participants: 4 Group Focus: abuse issues, activities of daily living skills, check in, communication, coping skills, individual meeting, relapse prevention, social skills, and substance abuse education Treatment Modality:  Individual Therapy, Leisure Development, and Spiritual Interventions utilized were leisure development, patient education, and support Purpose: enhance coping skills, express feelings, increase insight, relapse prevention strategies, and trigger / craving management  Name: Kevin Gentry Date of Birth: June 09, 1986  MR: [id number]    Level of Participation: PT DID NOT ATTEND GROUP Quality of Participation: cooperative Interactions with others: gave feedback Mood/Affect: appropriate Triggers (if applicable): NA Cognition: coherent/clear Progress: None Response: NA Plan: patient will be encouraged to keep going to groups  Patients Problems:  Patient Active Problem List   Diagnosis Date Noted   Alcohol use disorder, severe, dependence (HCC) 02/23/2024   Substance induced mood disorder (HCC) 02/22/2024   Alcohol abuse 06/30/2023   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

## 2024-02-25 DIAGNOSIS — R079 Chest pain, unspecified: Secondary | ICD-10-CM | POA: Diagnosis not present

## 2024-02-25 NOTE — ED Notes (Signed)
 PRN nicorette  given due to patient reports of nicotine  cravings. Medication administered with no complications. Environment secured, safety checks in place per facility policy.

## 2024-02-25 NOTE — ED Notes (Signed)
 Patient asleep at this time, no CIWA assessment done.

## 2024-02-25 NOTE — ED Notes (Signed)

## 2024-02-25 NOTE — ED Notes (Signed)
 Patient sitting in dayroom interacting with peers. No acute distress noted. No concerns voiced. No inappropriate behaviors observed or reported at this time. Informed patient to notify staff with any needs or assistance. Patient verbalized understanding or agreement. Safety checks in place per facility policy.

## 2024-02-25 NOTE — ED Provider Notes (Signed)
 Behavioral Health Progress Note  Date and Time: 02/25/2024 2:53 PM Name: Kevin Gentry MRN:  [id number]  Subjective:  Curly is seen in the common area on rounds. He is still having intermittent headaches but is more visible today. He has been calling around for sober living places but is still hoping for residential rehab. He is not sleeping well but appetite is okay. No other physical complaints. He denies hallucinations, thoughts of harm to self or others.   Diagnosis:  Final diagnoses:  Alcohol use disorder  Cocaine use disorder (HCC)  Tetrahydrocannabinol (THC) use disorder, severe, dependence (HCC)    Total Time spent with patient: 15 minutes  Past Psychiatric History: MDD v ?bipolar disorder?, GAD, no hosp, no SA. Previously on Depakote, Wellbutrin, Seroquel but stopped these 5 years ago due to side effects. Past Medical History: Denies any medical diagnoses Family History: No pertinent Family Psychiatric  History: Denies Social History: resides with the mother of his children, who are ages 59, 5, and 5. Reports that he currently does not work, but receives SSI income due to his bipolar disorder. his father is his emergency contact person, provided them as Ozell Bihari at [phone]. reports highest level of education as high school, states that he is single   Sleep: Poor  Appetite:  Good  Current Medications:  Current Facility-Administered Medications  Medication Dose Route Frequency Provider Last Rate Last Admin   acetaminophen  (TYLENOL ) tablet 650 mg  650 mg Oral Q6H PRN McCarty, Artie, MD   650 mg at 02/25/24 1229   alum & mag hydroxide-simeth (MAALOX/MYLANTA) 200-200-20 MG/5ML suspension 30 mL  30 mL Oral Q4H PRN McCarty, Artie, MD       haloperidol  (HALDOL ) tablet 5 mg  5 mg Oral TID PRN McCarty, Artie, MD       And   diphenhydrAMINE  (BENADRYL ) capsule 50 mg  50 mg Oral TID PRN McCarty, Artie, MD       haloperidol  lactate (HALDOL ) injection 5 mg  5 mg Intramuscular TID  PRN McCarty, Artie, MD       And   diphenhydrAMINE  (BENADRYL ) injection 50 mg  50 mg Intramuscular TID PRN McCarty, Artie, MD       And   LORazepam  (ATIVAN ) injection 2 mg  2 mg Intramuscular TID PRN McCarty, Artie, MD       haloperidol  lactate (HALDOL ) injection 10 mg  10 mg Intramuscular TID PRN McCarty, Artie, MD       And   diphenhydrAMINE  (BENADRYL ) injection 50 mg  50 mg Intramuscular TID PRN McCarty, Artie, MD       And   LORazepam  (ATIVAN ) injection 2 mg  2 mg Intramuscular TID PRN McCarty, Artie, MD       hydrOXYzine  (ATARAX ) tablet 25 mg  25 mg Oral Q6H PRN McCarty, Artie, MD       ibuprofen  (ADVIL ) tablet 400 mg  400 mg Oral Q6H PRN Cole Kandi BROCKS, MD   400 mg at 02/25/24 1128   lamoTRIgine  (LAMICTAL ) tablet 25 mg  25 mg Oral Daily Noa Galvao, Corean Massa, MD   25 mg at 02/25/24 9068   loperamide  (IMODIUM ) capsule 2-4 mg  2-4 mg Oral PRN McCarty, Artie, MD       LORazepam  (ATIVAN ) tablet 1 mg  1 mg Oral Q6H PRN McCarty, Artie, MD       magnesium  hydroxide (MILK OF MAGNESIA) suspension 30 mL  30 mL Oral Daily PRN McCarty, Artie, MD       multivitamin with  minerals tablet 1 tablet  1 tablet Oral Daily McCarty, Artie, MD   1 tablet at 02/25/24 0931   nicotine  polacrilex (NICORETTE ) gum 2 mg  2 mg Oral PRN McCarty, Artie, MD   2 mg at 02/25/24 1123   ondansetron  (ZOFRAN -ODT) disintegrating tablet 4 mg  4 mg Oral Q6H PRN McCarty, Artie, MD       thiamine  (VITAMIN B1) tablet 100 mg  100 mg Oral Daily McCarty, Artie, MD   100 mg at 02/25/24 0931   traZODone  (DESYREL ) tablet 50 mg  50 mg Oral QHS PRN McCarty, Artie, MD   50 mg at 02/24/24 2153   No current outpatient medications on file.    Labs  Lab Results:  Admission on 02/22/2024, Discharged on 02/22/2024  Component Date Value Ref Range Status   WBC 02/22/2024 10.5  4.0 - 10.5 K/uL Final   RBC 02/22/2024 4.58  4.22 - 5.81 MIL/uL Final   Hemoglobin 02/22/2024 13.0  13.0 - 17.0 g/dL Final   HCT 91/87/7974 39.1  39.0 - 52.0 %  Final   MCV 02/22/2024 85.4  80.0 - 100.0 fL Final   MCH 02/22/2024 28.4  26.0 - 34.0 pg Final   MCHC 02/22/2024 33.2  30.0 - 36.0 g/dL Final   RDW 91/87/7974 12.9  11.5 - 15.5 % Final   Platelets 02/22/2024 290  150 - 400 K/uL Final   nRBC 02/22/2024 0.0  0.0 - 0.2 % Final   Neutrophils Relative % 02/22/2024 69  % Final   Neutro Abs 02/22/2024 7.2  1.7 - 7.7 K/uL Final   Lymphocytes Relative 02/22/2024 18  % Final   Lymphs Abs 02/22/2024 1.8  0.7 - 4.0 K/uL Final   Monocytes Relative 02/22/2024 10  % Final   Monocytes Absolute 02/22/2024 1.1 (H)  0.1 - 1.0 K/uL Final   Eosinophils Relative 02/22/2024 2  % Final   Eosinophils Absolute 02/22/2024 0.2  0.0 - 0.5 K/uL Final   Basophils Relative 02/22/2024 0  % Final   Basophils Absolute 02/22/2024 0.0  0.0 - 0.1 K/uL Final   Immature Granulocytes 02/22/2024 1  % Final   Abs Immature Granulocytes 02/22/2024 0.05  0.00 - 0.07 K/uL Final   Performed at [facility], [address], [place], [place] [address]   Sodium 02/22/2024 141  135 - 145 mmol/L Final   Potassium 02/22/2024 3.5  3.5 - 5.1 mmol/L Final   Chloride 02/22/2024 103  98 - 111 mmol/L Final   CO2 02/22/2024 26  22 - 32 mmol/L Final   Glucose, Bld 02/22/2024 101 (H)  70 - 99 mg/dL Final   Glucose reference range applies only to samples taken after fasting for at least 8 hours.   BUN 02/22/2024 13  6 - 20 mg/dL Final   Creatinine, Ser 02/22/2024 1.08  0.61 - 1.24 mg/dL Final   Calcium 91/87/7974 9.0  8.9 - 10.3 mg/dL Final   Total Protein 91/87/7974 6.7  6.5 - 8.1 g/dL Final   Albumin 91/87/7974 3.9  3.5 - 5.0 g/dL Final   AST 91/87/7974 53 (H)  15 - 41 U/L Final   ALT 02/22/2024 65 (H)  0 - 44 U/L Final   Alkaline Phosphatase 02/22/2024 41  38 - 126 U/L Final   Total Bilirubin 02/22/2024 0.9  0.0 - 1.2 mg/dL Final   GFR, Estimated 02/22/2024 >60  >60 mL/min Final   Comment: (NOTE) Calculated using the CKD-EPI Creatinine Equation (2021)    Anion gap 02/22/2024 12  5  - 15 Final   Performed at [facility], [address], [place], [place] [address]   Hgb A1c MFr Bld 02/22/2024 6.0 (H)  4.8 - 5.6 % Final   Comment: (NOTE)         Prediabetes: 5.7 - 6.4         Diabetes: >6.4         Glycemic control for adults with diabetes: <7.0    Mean Plasma Glucose 02/22/2024 126  mg/dL Final   Comment: (NOTE) Performed At: [facility] [address] [place], [place] [id number] Jennette Shorter MD Ey:[id number]    Alcohol, Ethyl (B) 02/22/2024 <15  <15 mg/dL Final   Comment: (NOTE) For medical purposes only. Performed at [facility], [address], [place], [place] [address]    Magnesium  02/22/2024 2.1  1.7 - 2.4 mg/dL Final   Performed at [facility], [address] [place], [place] [address]   Cholesterol 02/22/2024 134  0 - 200 mg/dL Final   Triglycerides 91/87/7974 213 (H)  <150 mg/dL Final   HDL 91/87/7974 46  >40 mg/dL Final   Total CHOL/HDL Ratio 02/22/2024 2.9  RATIO Final   VLDL 02/22/2024 43 (H)  0 - 40 mg/dL Final   LDL Cholesterol 02/22/2024 45  0 - 99 mg/dL Final   Comment:        Total Cholesterol/HDL:CHD Risk Coronary Heart Disease Risk Table                     Men   Women  1/2 Average Risk   3.4   3.3  Average Risk       5.0   4.4  2 X Average Risk   9.6   7.1  3 X Average Risk  23.4   11.0        Use the calculated Patient Ratio above and the CHD Risk Table to determine the patient's CHD Risk.        ATP III CLASSIFICATION (LDL):  <100     mg/dL   Optimal  899-870  mg/dL   Near or Above                    Optimal  130-159  mg/dL   Borderline  839-810  mg/dL   High  >809     mg/dL   Very High Performed at [facility], [address] [place], [place] [address]    TSH 02/22/2024 2.674  0.350 - 4.500 uIU/mL Final   Comment: Performed by a 3rd Generation assay with a functional sensitivity of <=0.01 uIU/mL. Performed at [facility], [address], [place], [place] [address]     Color, Urine 02/22/2024 YELLOW  YELLOW Final   APPearance 02/22/2024 CLEAR  CLEAR Final   Specific Gravity, Urine 02/22/2024 1.014  1.005 - 1.030 Final   pH 02/22/2024 5.0  5.0 - 8.0 Final   Glucose, UA 02/22/2024 NEGATIVE  NEGATIVE mg/dL Final   Hgb urine dipstick 02/22/2024 NEGATIVE  NEGATIVE Final   Bilirubin Urine 02/22/2024 NEGATIVE  NEGATIVE Final   Ketones, ur 02/22/2024 5 (A)  NEGATIVE mg/dL Final   Protein, ur 91/87/7974 30 (A)  NEGATIVE mg/dL Final   Nitrite 91/87/7974 NEGATIVE  NEGATIVE Final   Leukocytes,Ua 02/22/2024 NEGATIVE  NEGATIVE Final   RBC / HPF 02/22/2024 0-5  0 - 5 RBC/hpf Final   WBC, UA 02/22/2024 0-5  0 - 5  WBC/hpf Final   Bacteria, UA 02/22/2024 NONE SEEN  NONE SEEN Final   Squamous Epithelial / HPF 02/22/2024 0-5  0 - 5 /HPF Final   Mucus 02/22/2024 PRESENT   Final   Performed at [facility], [address] [place], [place] [address]   POC Amphetamine UR 02/22/2024 None Detected  NONE DETECTED (Cut Off Level 1000 ng/mL) Final   POC Secobarbital (BAR) 02/22/2024 None Detected  NONE DETECTED (Cut Off Level 300 ng/mL) Final   POC Buprenorphine (BUP) 02/22/2024 None Detected  NONE DETECTED (Cut Off Level 10 ng/mL) Final   POC Oxazepam (BZO) 02/22/2024 None Detected  NONE DETECTED (Cut Off Level 300 ng/mL) Final   POC Cocaine UR 02/22/2024 Positive (A)  NONE DETECTED (Cut Off Level 300 ng/mL) Final   POC Methamphetamine UR 02/22/2024 None Detected  NONE DETECTED (Cut Off Level 1000 ng/mL) Final   POC Morphine  02/22/2024 None Detected  NONE DETECTED (Cut Off Level 300 ng/mL) Final   POC Methadone UR 02/22/2024 None Detected  NONE DETECTED (Cut Off Level 300 ng/mL) Final   POC Oxycodone  UR 02/22/2024 None Detected  NONE DETECTED (Cut Off Level 100 ng/mL) Final   POC Marijuana UR 02/22/2024 Positive (A)  NONE DETECTED (Cut Off Level 50 ng/mL) Final  Admission on 02/21/2024, Discharged on 02/22/2024  Component Date Value Ref Range Status   Sodium 02/21/2024 140   135 - 145 mmol/L Final   Potassium 02/21/2024 3.8  3.5 - 5.1 mmol/L Final   Chloride 02/21/2024 103  98 - 111 mmol/L Final   CO2 02/21/2024 21 (L)  22 - 32 mmol/L Final   Glucose, Bld 02/21/2024 111 (H)  70 - 99 mg/dL Final   Glucose reference range applies only to samples taken after fasting for at least 8 hours.   BUN 02/21/2024 13  6 - 20 mg/dL Final   Creatinine, Ser 02/21/2024 1.25 (H)  0.61 - 1.24 mg/dL Final   Calcium 91/88/7974 9.2  8.9 - 10.3 mg/dL Final   GFR, Estimated 02/21/2024 >60  >60 mL/min Final   Comment: (NOTE) Calculated using the CKD-EPI Creatinine Equation (2021)    Anion gap 02/21/2024 16 (H)  5 - 15 Final   Performed at [facility], [address] [place], [place] [address]   WBC 02/21/2024 16.9 (H)  4.0 - 10.5 K/uL Final   RBC 02/21/2024 4.62  4.22 - 5.81 MIL/uL Final   Hemoglobin 02/21/2024 13.1  13.0 - 17.0 g/dL Final   HCT 91/88/7974 40.5  39.0 - 52.0 % Final   MCV 02/21/2024 87.7  80.0 - 100.0 fL Final   MCH 02/21/2024 28.4  26.0 - 34.0 pg Final   MCHC 02/21/2024 32.3  30.0 - 36.0 g/dL Final   RDW 91/88/7974 13.0  11.5 - 15.5 % Final   Platelets 02/21/2024 306  150 - 400 K/uL Final   nRBC 02/21/2024 0.0  0.0 - 0.2 % Final   Performed at [facility], [address], [place], [place] [address]   Troponin I (High Sensitivity) 02/21/2024 5  <18 ng/L Final   Comment: (NOTE) Elevated high sensitivity troponin I (hsTnI) values and significant  changes across serial measurements may suggest ACS but many other  chronic and acute conditions are known to elevate hsTnI results.  Refer to the Links section for chest pain algorithms and additional  guidance. Performed at [facility], [address], [place], [place] [address]    Troponin I (High Sensitivity)  02/21/2024 6  <18 ng/L Final   Comment: (NOTE) Elevated high sensitivity troponin I (hsTnI) values and significant  changes across serial measurements may suggest ACS but many other   chronic and acute conditions are known to elevate hsTnI results.  Refer to the Links section for chest pain algorithms and additional  guidance. Performed at [facility], [address], [place], [place] [address]     Blood Alcohol level:  Lab Results  Component Value Date   [facility] <15 02/22/2024   ETH <10 06/30/2023    Metabolic Disorder Labs: Lab Results  Component Value Date   HGBA1C 6.0 (H) 02/22/2024   MPG 126 02/22/2024   No results found for: PROLACTIN Lab Results  Component Value Date   CHOL 134 02/22/2024   TRIG 213 (H) 02/22/2024   HDL 46 02/22/2024   CHOLHDL 2.9 02/22/2024   VLDL 43 (H) 02/22/2024   LDLCALC 45 02/22/2024   LDLCALC 71 04/03/2010    Therapeutic Lab Levels: No results found for: LITHIUM No results found for: VALPROATE No results found for: CBMZ  Physical Findings   PHQ2-9    Flowsheet Row ED from 02/22/2024 in [facility] Most recent reading at 02/22/2024  7:32 PM ED from 06/30/2023 in [facility] Most recent reading at 07/01/2023  8:08 AM ED from 06/30/2023 in [facility] Most recent reading at 06/30/2023 10:47 PM  PHQ-2 Total Score 4 4 4   PHQ-9 Total Score 16 11 11    Flowsheet Row ED from 02/22/2024 in [facility] Most recent reading at 02/22/2024 10:41 AM ED from 02/22/2024 in [facility] Most recent reading at 02/22/2024  8:12 AM ED from 02/21/2024 in [facility] Most recent reading at 02/21/2024  8:17 PM  C-SSRS RISK CATEGORY No Risk No Risk No Risk     Musculoskeletal  Strength & Muscle Tone: within normal limits Gait & Station: normal Patient leans: N/A  Psychiatric Specialty Exam  Presentation  General Appearance:  Appropriate for Environment  Eye Contact: Fair  Speech: Normal Rate  Speech  Volume: Normal  Handedness: Right   Mood and Affect  Mood: Euthymic  Affect: Blunt   Thought Process  Thought Processes: Linear  Descriptions of Associations:Intact  Orientation:Full (Time, Place and Person)  Thought Content:Logical  Diagnosis of Schizophrenia or Schizoaffective disorder in past: No    Hallucinations:Hallucinations: None  Ideas of Reference:None  Suicidal Thoughts:Suicidal Thoughts: No  Homicidal Thoughts:Homicidal Thoughts: No   Sensorium  Memory: Immediate Good; Recent Good  Judgment: Good  Insight: Good   Executive Functions  Concentration: Good  Attention Span: Good  Recall: Good  Fund of Knowledge: Good  Language: Good   Psychomotor Activity  Psychomotor Activity: Psychomotor Activity: Normal   Assets  Assets: Communication Skills; Desire for Improvement; Leisure Time   Sleep  Sleep: Sleep: Poor  Estimated Sleeping Duration (Last 24 Hours): 9.25-11.75 hours  No data recorded  Physical Exam  Physical Exam Vitals and nursing note reviewed.  HENT:     Head: Normocephalic and atraumatic.  Eyes:     Extraocular Movements: Extraocular movements intact.  Pulmonary:     Effort: Pulmonary effort is normal.  Musculoskeletal:        General: Normal range of motion.     Cervical back: Normal range of motion.  Neurological:     General: No focal deficit present.  Mental Status: He is alert and oriented to person, place, and time.  Psychiatric:        Behavior: Behavior normal.    Review of Systems  Gastrointestinal:  Negative for constipation, diarrhea, nausea and vomiting.  Genitourinary:  Negative for dysuria.  Musculoskeletal:  Negative for myalgias.  Neurological:  Positive for headaches. Negative for dizziness.  Psychiatric/Behavioral:  Negative for hallucinations and suicidal ideas.    Blood pressure 122/64, pulse 94, temperature 98.5 F (36.9 C), temperature source Oral, resp. rate 19, SpO2  100%. There is no height or weight on file to calculate BMI.  Treatment Plan Summary: Daily contact with patient to assess and evaluate symptoms and progress in treatment and Medication management   Patient has significant psychiatric history but does not want to restart medications.  Patient is not not harm to self or others at this time.     Safety and Monitoring: Voluntary admission to inpatient psychiatric unit for safety, stabilization and treatment Daily contact with patient to assess and evaluate symptoms and progress in treatment Patient's case to be discussed in multi-disciplinary team meeting Observation Level : q15 minute checks Vital signs: q12 hours Precautions: Safety   Long Term Goal(s): Improvement in symptoms so as ready for discharge   Short Term Goals: Ability to identify changes in lifestyle to reduce recurrence of condition will improve, Ability to verbalize feelings will improve, Ability to disclose and discuss suicidal ideas, Ability to demonstrate self-control will improve, Ability to identify and develop effective coping behaviors will improve, Ability to maintain clinical measurements within normal limits will improve, Compliance with prescribed medications will improve, and Ability to identify triggers associated with substance abuse/mental health issues will improve   Psychiatric medications: -started lamictal  25mg  PO daily for bipolar disorder    PRNS -Continue Tylenol  650 mg every 6 hours PRN for mild pain -Continue Maalox 30 mg every 4 hrs PRN for indigestion -Continue Milk of Magnesia as needed every 6 hrs for constipation -Continue trazodone  once at bedtime for insomnia -Continue nicotine  gum as needed -Continue Ativan  1 mg for CIWA >10 -Continue ibuprofen  40 mg once every 6 hours as needed for headache and moderate pain -Continue Imodium  as needed for diarrhea -Continue hydroxy 25 mg as needed for anxiety Continue ondansetron  4 mg as needed for nausea  and vomiting   Discharge Planning: Social work and case management to assist with discharge planning and identification of hospital follow-up needs prior to discharge Estimated LOS: 5-7 days Discharge Concerns: Need to establish a safety plan; Medication compliance and effectiveness Discharge Goals: Return home with outpatient referrals for mental health follow-up including medication management/psychotherapy  Corean Anette Potters, MD 02/25/2024 2:53 PM

## 2024-02-25 NOTE — ED Notes (Signed)
 Patient resting with eyes closed in no apparent acute distress. Respirations even and unlabored. Environment secured. Safety checks in place according to facility policy.

## 2024-02-25 NOTE — ED Notes (Signed)
 Patient is in the bedroom composed and sleeping. NAD. Will monitor for safety.

## 2024-02-25 NOTE — Discharge Planning (Signed)
 SW received call from Jan at [facility] and patient was approved but will not have a bed until 8/27. Patient was denied at [facility]. [facility] were also [facility] to [facility] who only needed verification of medications for approval and would return call to this SW. Additional referrals being reviewed by [place] and [facility].  SW left multiple messages with [facility] and will continue to follow.

## 2024-02-25 NOTE — ED Notes (Signed)
 Patient sitting in courtyard with MHT and peers. Calm and composed. No acute distress noted. No concerns voiced. No inappropriate behaviors observed or reported at this time. Informed patient to notify staff with any needs or assistance. Patient verbalized understanding or agreement. Safety checks in place per facility policy.

## 2024-02-25 NOTE — ED Notes (Signed)
 PRN advil  given due to patient reports of headache rating 8/10. Medication administered with no complications. Environment secured, safety checks in place per facility policy.

## 2024-02-25 NOTE — [facility] Group Notes (Signed)
 SPIRITUALITY GROUP NOTE  Spirituality group facilitated by Chaplain Evelisse Szalkowski, MDiv, BCC.  Group Description:  Group focused on topic of hope.  Patients participated in facilitated discussion around topic, connecting with one another around experiences and definitions for hope.  Group members engaged with visual explorer photos, reflecting on what hope looks like for them today.  Group engaged in discussion around how their definitions of hope are present today in hospital.   Modalities: Psycho-social ed, Adlerian, Narrative, MI Patient Progress: Present throughout group.  Actively participated in discussion.

## 2024-02-25 NOTE — Group Note (Signed)
 Group Topic: Social Support  Group Date: 02/25/2024 Start Time: 1230 End Time: 1300 Facilitators: Joandy Burget, Zane HERO, RN; Morton, Comfort C, RN  Department: [facility]  Number of Participants: 1  Group Focus: check in Treatment Modality:  Individual Therapy Interventions utilized were support Purpose: express feelings  Name: Kevin Gentry Date of Birth: [birth year]  MR: [id number]    Level of Participation: moderate Quality of Participation: attentive and cooperative Interactions with others: gave feedback Mood/Affect: appropriate Triggers (if applicable): None identified at this time Cognition: coherent/clear and logical Progress: Gaining insight Response: Patient shared with staff member. Patient had previously reported a headache which has since been relieved since PRN medications were administered. No further complaints at this time. Patient voices understanding of discharge planning which has been dicussed today. Patient in no apparent acute distress, calm and composed.  Plan: patient will be encouraged to continue to attend groups/programming on the unit  Patients Problems:  Patient Active Problem List   Diagnosis Date Noted   Alcohol use disorder, severe, dependence (HCC) 02/23/2024   Substance induced mood disorder (HCC) 02/22/2024   Alcohol abuse 06/30/2023   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

## 2024-02-25 NOTE — Group Note (Signed)
 Group Topic: Understanding Self  Group Date: 02/25/2024 Start Time: 1945 End Time: 2005 Facilitators: Nathanael Moulder, NT; Anice Benton LABOR, NT  Department: [facility]  Number of Participants: 7  Group Focus: acceptance and coping skills Treatment Modality:  Patient-Centered Therapy Interventions utilized were group exercise Purpose: explore maladaptive thinking  Name: Kevin Gentry Date of Birth: 11-Jan-1986  MR: [id number]    Level of Participation: Participated and answered questions during first half of group, afterwards sat on the bench in courtyard and watched Quality of Participation: engaged Interactions with others: gave feedback Mood/Affect: appropriate Triggers (if applicable): NA Cognition: coherent/clear Progress: Gaining insight Response: Good Plan: follow-up needed  Patients Problems:  Patient Active Problem List   Diagnosis Date Noted   Alcohol use disorder, severe, dependence (HCC) 02/23/2024   Substance induced mood disorder (HCC) 02/22/2024   Alcohol abuse 06/30/2023   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

## 2024-02-26 DIAGNOSIS — R079 Chest pain, unspecified: Secondary | ICD-10-CM | POA: Diagnosis not present

## 2024-02-26 MED ORDER — MELATONIN 5 MG PO TABS
5.0000 mg | ORAL_TABLET | Freq: Every day | ORAL | Status: DC
  Administered 2024-02-26 – 2024-02-27 (×2): 5 mg via ORAL
  Filled 2024-02-26: qty 7
  Filled 2024-02-26 (×2): qty 1

## 2024-02-26 MED ORDER — TRAZODONE HCL 50 MG PO TABS
50.0000 mg | ORAL_TABLET | Freq: Once | ORAL | Status: AC
  Administered 2024-02-26: 50 mg via ORAL
  Filled 2024-02-26: qty 1

## 2024-02-26 NOTE — ED Notes (Signed)
 Patient has a problem of staying asleep. He is turning in the bed

## 2024-02-26 NOTE — ED Notes (Signed)
 Pt sitting in dayroom watching television and interacting with peers. No acute distress noted. No concerns voiced. Informed pt to notify staff with any needs or assistance. Pt verbalized understanding and agreement. Will continue to monitor for safety.

## 2024-02-26 NOTE — Group Note (Signed)
 Group Topic: Overcoming Obstacles  Group Date: 02/26/2024 Start Time: 1000 End Time: 1100 Facilitators: Alyse Leilani LABOR, NT  Department: [facility]  Number of Participants: 9  Group Focus: acceptance, chemical dependency education, coping skills, and daily focus Treatment Modality:  Individual Therapy Interventions utilized were group exercise Purpose: enhance coping skills and express irrational fears  Name: Kevin Gentry Date of Birth: 08/25/1985  MR: [id number]   Pt did not attend group. Patients Problems:  Patient Active Problem List   Diagnosis Date Noted   Alcohol use disorder, severe, dependence (HCC) 02/23/2024   Substance induced mood disorder (HCC) 02/22/2024   Alcohol abuse 06/30/2023   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

## 2024-02-26 NOTE — Group Note (Signed)
 Group Topic: Relapse and Recovery  Group Date: 02/26/2024 Start Time: 2000 End Time: 2100 Facilitators: Joan Plowman B  Department: [facility]  Number of Participants: 7  Group Focus: abuse issues, chemical dependency education, community group, and dual diagnosis Treatment Modality:  Leisure Counsellor and Spiritual Interventions utilized were leisure development and patient education Purpose: enhance coping skills, increase insight, relapse prevention strategies, and trigger / craving management  Name: Kevin Gentry Date of Birth: [birth year]  MR: [id number]    Level of Participation: active Quality of Participation: cooperative Interactions with others: gave feedback Mood/Affect: appropriate Triggers (if applicable): NA Cognition: coherent/clear Progress: Gaining insight Response: NA Plan: patient will be encouraged to keep going to groups  Patients Problems:  Patient Active Problem List   Diagnosis Date Noted   Alcohol use disorder, severe, dependence (HCC) 02/23/2024   Substance induced mood disorder (HCC) 02/22/2024   Alcohol abuse 06/30/2023   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

## 2024-02-26 NOTE — ED Provider Notes (Addendum)
 Behavioral Health Progress Note  Date and Time: 02/26/2024 1:34 PM Name: Kevin Gentry MRN:  [id number]  Subjective:  Kevin Gentry was seen today in the common area on rounds. He didn't sleep very well but has okay energy today. No headache so far, but he has been really sweaty. He is eating okay. He was not able to make any new calls, but is hopeful to go to a place in [place] .   Diagnosis:  Final diagnoses:  Alcohol use disorder  Cocaine use disorder (HCC)  Tetrahydrocannabinol (THC) use disorder, severe, dependence (HCC)    Total Time spent with patient: 15 minutes  Past Psychiatric History: MDD v ?bipolar disorder?, GAD, no hosp, no SA. Previously on Depakote, Wellbutrin, Seroquel but stopped these 5 years ago due to side effects. Past Medical History: Denies any medical diagnoses Family History: No pertinent Family Psychiatric  History: Denies Social History: resides with the mother of his children, who are ages 59, 42, and 5. Reports that he currently does not work, but receives SSI income due to his bipolar disorder. his father is his emergency contact person, provided them as Kevin Gentry at [phone]. reports highest level of education as high school, states that he is single    Sleep: Fair  Appetite:  Good  Current Medications:  Current Facility-Administered Medications  Medication Dose Route Frequency Provider Last Rate Last Admin   acetaminophen  (TYLENOL ) tablet 650 mg  650 mg Oral Q6H PRN Kevin Gentry, Artie, MD   650 mg at 02/25/24 1229   alum & mag hydroxide-simeth (MAALOX/MYLANTA) 200-200-20 MG/5ML suspension 30 mL  30 mL Oral Q4H PRN Kevin Gentry, Artie, MD       haloperidol  (HALDOL ) tablet 5 mg  5 mg Oral TID PRN Kevin Gentry, Artie, MD       And   diphenhydrAMINE  (BENADRYL ) capsule 50 mg  50 mg Oral TID PRN Kevin Gentry, Artie, MD       haloperidol  lactate (HALDOL ) injection 5 mg  5 mg Intramuscular TID PRN Kevin Gentry, Artie, MD       And   diphenhydrAMINE  (BENADRYL ) injection 50 mg   50 mg Intramuscular TID PRN Kevin Gentry, Artie, MD       And   LORazepam  (ATIVAN ) injection 2 mg  2 mg Intramuscular TID PRN Kevin Gentry, Artie, MD       haloperidol  lactate (HALDOL ) injection 10 mg  10 mg Intramuscular TID PRN Kevin Gentry, Artie, MD       And   diphenhydrAMINE  (BENADRYL ) injection 50 mg  50 mg Intramuscular TID PRN Kevin Gentry, Artie, MD       And   LORazepam  (ATIVAN ) injection 2 mg  2 mg Intramuscular TID PRN Kevin Gentry, Artie, MD       ibuprofen  (ADVIL ) tablet 400 mg  400 mg Oral Q6H PRN Kevin Kandi BROCKS, MD   400 mg at 02/25/24 1128   lamoTRIgine  (LAMICTAL ) tablet 25 mg  25 mg Oral Daily Kevin Corean Massa, MD   25 mg at 02/26/24 9081   magnesium  hydroxide (MILK OF MAGNESIA) suspension 30 mL  30 mL Oral Daily PRN Kevin Gentry, Artie, MD       multivitamin with minerals tablet 1 tablet  1 tablet Oral Daily Kevin Gentry, Artie, MD   1 tablet at 02/26/24 9081   nicotine  polacrilex (NICORETTE ) gum 2 mg  2 mg Oral PRN Kevin Gentry, Artie, MD   2 mg at 02/26/24 9175   thiamine  (VITAMIN B1) tablet 100 mg  100 mg Oral Daily Kevin Gentry, Artie, MD   100 mg at 02/26/24  9081   traZODone  (DESYREL ) tablet 50 mg  50 mg Oral QHS PRN Kevin Gentry, Artie, MD   50 mg at 02/25/24 2129   No current outpatient medications on file.    Labs  Lab Results:  Admission on 02/22/2024, Discharged on 02/22/2024  Component Date Value Ref Range Status   WBC 02/22/2024 10.5  4.0 - 10.5 K/uL Final   RBC 02/22/2024 4.58  4.22 - 5.81 MIL/uL Final   Hemoglobin 02/22/2024 13.0  13.0 - 17.0 g/dL Final   HCT 91/87/7974 39.1  39.0 - 52.0 % Final   MCV 02/22/2024 85.4  80.0 - 100.0 fL Final   MCH 02/22/2024 28.4  26.0 - 34.0 pg Final   MCHC 02/22/2024 33.2  30.0 - 36.0 g/dL Final   RDW 91/87/7974 12.9  11.5 - 15.5 % Final   Platelets 02/22/2024 290  150 - 400 K/uL Final   nRBC 02/22/2024 0.0  0.0 - 0.2 % Final   Neutrophils Relative % 02/22/2024 69  % Final   Neutro Abs 02/22/2024 7.2  1.7 - 7.7 K/uL Final   Lymphocytes Relative 02/22/2024  18  % Final   Lymphs Abs 02/22/2024 1.8  0.7 - 4.0 K/uL Final   Monocytes Relative 02/22/2024 10  % Final   Monocytes Absolute 02/22/2024 1.1 (H)  0.1 - 1.0 K/uL Final   Eosinophils Relative 02/22/2024 2  % Final   Eosinophils Absolute 02/22/2024 0.2  0.0 - 0.5 K/uL Final   Basophils Relative 02/22/2024 0  % Final   Basophils Absolute 02/22/2024 0.0  0.0 - 0.1 K/uL Final   Immature Granulocytes 02/22/2024 1  % Final   Abs Immature Granulocytes 02/22/2024 0.05  0.00 - 0.07 K/uL Final   Performed at [facility], [address] [place], [place] [address]   Sodium 02/22/2024 141  135 - 145 mmol/L Final   Potassium 02/22/2024 3.5  3.5 - 5.1 mmol/L Final   Chloride 02/22/2024 103  98 - 111 mmol/L Final   CO2 02/22/2024 26  22 - 32 mmol/L Final   Glucose, Bld 02/22/2024 101 (H)  70 - 99 mg/dL Final   Glucose reference range applies only to samples taken after fasting for at least 8 hours.   BUN 02/22/2024 13  6 - 20 mg/dL Final   Creatinine, Ser 02/22/2024 1.08  0.61 - 1.24 mg/dL Final   Calcium 91/87/7974 9.0  8.9 - 10.3 mg/dL Final   Total Protein 91/87/7974 6.7  6.5 - 8.1 g/dL Final   Albumin 91/87/7974 3.9  3.5 - 5.0 g/dL Final   AST 91/87/7974 53 (H)  15 - 41 U/L Final   ALT 02/22/2024 65 (H)  0 - 44 U/L Final   Alkaline Phosphatase 02/22/2024 41  38 - 126 U/L Final   Total Bilirubin 02/22/2024 0.9  0.0 - 1.2 mg/dL Final   GFR, Estimated 02/22/2024 >60  >60 mL/min Final   Comment: (NOTE) Calculated using the CKD-EPI Creatinine Equation (2021)    Anion gap 02/22/2024 12  5 - 15 Final   Performed at [facility], [address] [place], [place] [address]   Hgb A1c MFr Bld 02/22/2024 6.0 (H)  4.8 - 5.6 % Final   Comment: (NOTE)         Prediabetes: 5.7 - 6.4         Diabetes: >6.4         Glycemic control for adults with diabetes: <7.0    Mean Plasma Glucose 02/22/2024 126  mg/dL Final   Comment: (NOTE) Performed At: [facility] [address] [place],  [place] [id number] Jennette Shorter MD Ey:[id number]    Alcohol, Ethyl (B) 02/22/2024 <15  <15 mg/dL Final   Comment: (NOTE) For medical purposes only. Performed at [facility], [address] [place], [place] [address]    Magnesium  02/22/2024 2.1  1.7 - 2.4 mg/dL Final   Performed at [facility], [address] [place], [place] [address]   Cholesterol 02/22/2024 134  0 - 200 mg/dL Final   Triglycerides 91/87/7974 213 (H)  <150 mg/dL Final   HDL 91/87/7974 46  >40 mg/dL Final   Total CHOL/HDL Ratio 02/22/2024 2.9  RATIO Final   VLDL 02/22/2024 43 (H)  0 - 40 mg/dL Final   LDL Cholesterol 02/22/2024 45  0 - 99 mg/dL Final   Comment:        Total Cholesterol/HDL:CHD Risk Coronary Heart Disease Risk Table                     Men   Women  1/2 Average Risk   3.4   3.3  Average Risk       5.0   4.4  2 X Average Risk   9.6   7.1  3 X Average Risk  23.4   11.0        Use the calculated Patient Ratio above and the CHD Risk Table to determine the patient's CHD Risk.        ATP III CLASSIFICATION (LDL):  <100     mg/dL   Optimal  899-870  mg/dL   Near or Above                    Optimal  130-159  mg/dL   Borderline  839-810  mg/dL   High  >809     mg/dL   Very High Performed at [facility], [address] [place], [place] [address]    TSH 02/22/2024 2.674  0.350 - 4.500 uIU/mL Final   Comment: Performed by a 3rd Generation assay with a functional sensitivity of <=0.01 uIU/mL. Performed at [facility], [address], [place], [place] [address]    Color, Urine 02/22/2024 YELLOW  YELLOW Final   APPearance 02/22/2024 CLEAR  CLEAR Final   Specific Gravity, Urine 02/22/2024 1.014  1.005 - 1.030 Final   pH 02/22/2024 5.0  5.0 - 8.0 Final   Glucose, UA 02/22/2024 NEGATIVE  NEGATIVE mg/dL Final   Hgb urine dipstick 02/22/2024 NEGATIVE  NEGATIVE Final   Bilirubin Urine 02/22/2024 NEGATIVE  NEGATIVE Final   Ketones, ur 02/22/2024 5 (A)  NEGATIVE mg/dL Final    Protein, ur 91/87/7974 30 (A)  NEGATIVE mg/dL Final   Nitrite 91/87/7974 NEGATIVE  NEGATIVE Final   Leukocytes,Ua 02/22/2024 NEGATIVE  NEGATIVE Final   RBC / HPF 02/22/2024 0-5  0 - 5 RBC/hpf Final   WBC, UA 02/22/2024 0-5  0 - 5 WBC/hpf Final   Bacteria, UA 02/22/2024 NONE SEEN  NONE SEEN Final   Squamous Epithelial / HPF 02/22/2024 0-5  0 - 5 /HPF Final   Mucus 02/22/2024 PRESENT   Final   Performed at [facility], [address], [place], [place] [address]   POC Amphetamine UR 02/22/2024 None Detected  NONE DETECTED (Cut Off Level 1000 ng/mL) Final   POC Secobarbital (BAR) 02/22/2024 None Detected  NONE DETECTED (Cut Off Level 300 ng/mL) Final  POC Buprenorphine (BUP) 02/22/2024 None Detected  NONE DETECTED (Cut Off Level 10 ng/mL) Final   POC Oxazepam (BZO) 02/22/2024 None Detected  NONE DETECTED (Cut Off Level 300 ng/mL) Final   POC Cocaine UR 02/22/2024 Positive (A)  NONE DETECTED (Cut Off Level 300 ng/mL) Final   POC Methamphetamine UR 02/22/2024 None Detected  NONE DETECTED (Cut Off Level 1000 ng/mL) Final   POC Morphine  02/22/2024 None Detected  NONE DETECTED (Cut Off Level 300 ng/mL) Final   POC Methadone UR 02/22/2024 None Detected  NONE DETECTED (Cut Off Level 300 ng/mL) Final   POC Oxycodone  UR 02/22/2024 None Detected  NONE DETECTED (Cut Off Level 100 ng/mL) Final   POC Marijuana UR 02/22/2024 Positive (A)  NONE DETECTED (Cut Off Level 50 ng/mL) Final  Admission on 02/21/2024, Discharged on 02/22/2024  Component Date Value Ref Range Status   Sodium 02/21/2024 140  135 - 145 mmol/L Final   Potassium 02/21/2024 3.8  3.5 - 5.1 mmol/L Final   Chloride 02/21/2024 103  98 - 111 mmol/L Final   CO2 02/21/2024 21 (L)  22 - 32 mmol/L Final   Glucose, Bld 02/21/2024 111 (H)  70 - 99 mg/dL Final   Glucose reference range applies only to samples taken after fasting for at least 8 hours.   BUN 02/21/2024 13  6 - 20 mg/dL Final   Creatinine, Ser 02/21/2024 1.25 (H)  0.61 - 1.24  mg/dL Final   Calcium 91/88/7974 9.2  8.9 - 10.3 mg/dL Final   GFR, Estimated 02/21/2024 >60  >60 mL/min Final   Comment: (NOTE) Calculated using the CKD-EPI Creatinine Equation (2021)    Anion gap 02/21/2024 16 (H)  5 - 15 Final   Performed at [facility], [address], [place], [place] [address]   WBC 02/21/2024 16.9 (H)  4.0 - 10.5 K/uL Final   RBC 02/21/2024 4.62  4.22 - 5.81 MIL/uL Final   Hemoglobin 02/21/2024 13.1  13.0 - 17.0 g/dL Final   HCT 91/88/7974 40.5  39.0 - 52.0 % Final   MCV 02/21/2024 87.7  80.0 - 100.0 fL Final   MCH 02/21/2024 28.4  26.0 - 34.0 pg Final   MCHC 02/21/2024 32.3  30.0 - 36.0 g/dL Final   RDW 91/88/7974 13.0  11.5 - 15.5 % Final   Platelets 02/21/2024 306  150 - 400 K/uL Final   nRBC 02/21/2024 0.0  0.0 - 0.2 % Final   Performed at [facility], [address], [place], [place] [address]   Troponin I (High Sensitivity) 02/21/2024 5  <18 ng/L Final   Comment: (NOTE) Elevated high sensitivity troponin I (hsTnI) values and significant  changes across serial measurements may suggest ACS but many other  chronic and acute conditions are known to elevate hsTnI results.  Refer to the Links section for chest pain algorithms and additional  guidance. Performed at [facility], [address], [place], [place] [address]    Troponin I (High Sensitivity) 02/21/2024 6  <18 ng/L Final   Comment: (NOTE) Elevated high sensitivity troponin I (hsTnI) values and significant  changes across serial measurements may suggest ACS but many other  chronic and acute conditions are known to elevate hsTnI results.  Refer to the Links section for chest pain algorithms and additional  guidance. Performed at [facility], [address], [place], [place] [address]     Blood Alcohol level:  Lab Results  Component Value Date   ETH <15  02/22/2024   ETH <10 06/30/2023    Metabolic Disorder Labs: Lab Results  Component Value Date   HGBA1C  6.0 (H) 02/22/2024   MPG 126 02/22/2024   No results found for: PROLACTIN Lab Results  Component Value Date   CHOL 134 02/22/2024   TRIG 213 (H) 02/22/2024   HDL 46 02/22/2024   CHOLHDL 2.9 02/22/2024   VLDL 43 (H) 02/22/2024   LDLCALC 45 02/22/2024   LDLCALC 71 04/03/2010    Therapeutic Lab Levels: No results found for: LITHIUM No results found for: VALPROATE No results found for: CBMZ  Physical Findings   PHQ2-9    Flowsheet Row ED from 02/22/2024 in [facility] Most recent reading at 02/22/2024  7:32 PM ED from 06/30/2023 in [facility] Most recent reading at 07/01/2023  8:08 AM ED from 06/30/2023 in [facility] Most recent reading at 06/30/2023 10:47 PM  PHQ-2 Total Score 4 4 4   PHQ-9 Total Score 16 11 11    Flowsheet Row ED from 02/22/2024 in [facility] Most recent reading at 02/22/2024 10:41 AM ED from 02/22/2024 in [facility] Most recent reading at 02/22/2024  8:12 AM ED from 02/21/2024 in [facility] Department at [facility] Most recent reading at 02/21/2024  8:17 PM  C-SSRS RISK CATEGORY No Risk No Risk No Risk     Musculoskeletal  Strength & Muscle Tone: within normal limits Gait & Station: normal Patient leans: N/A  Psychiatric Specialty Exam  Presentation  General Appearance:  Casual  Eye Contact: Fair  Speech: Normal Rate  Speech Volume: Normal  Handedness: Right   Mood and Affect  Mood: Anxious  Affect: Congruent   Thought Process  Thought Processes: Linear  Descriptions of Associations:Intact  Orientation:Full (Time, Place and Person)  Thought Content:Logical  Diagnosis of Schizophrenia or Schizoaffective disorder in past: No    Hallucinations:Hallucinations: None  Ideas of Reference:None  Suicidal Thoughts:Suicidal Thoughts: No  Homicidal  Thoughts:Homicidal Thoughts: No   Sensorium  Memory: Immediate Good; Recent Fair  Judgment: Fair  Insight: Fair   Executive Functions  Concentration: Good  Attention Span: Good  Recall: Good  Fund of Knowledge: Good  Language: Good   Psychomotor Activity  Psychomotor Activity: Psychomotor Activity: Normal   Assets  Assets: Communication Skills; Desire for Improvement; Physical Health   Sleep  Sleep: Sleep: Fair  Estimated Sleeping Duration (Last 24 Hours): 9.25-9.75 hours  No data recorded  Physical Exam  Physical Exam Vitals and nursing note reviewed.  Constitutional:      Appearance: Normal appearance.  Eyes:     Extraocular Movements: Extraocular movements intact.  Pulmonary:     Effort: Pulmonary effort is normal.  Musculoskeletal:        General: Normal range of motion.     Cervical back: Normal range of motion.  Neurological:     General: No focal deficit present.     Mental Status: He is alert and oriented to person, place, and time.  Psychiatric:        Behavior: Behavior normal.    Review of Systems  Constitutional:  Positive for diaphoresis. Negative for fever.  Gastrointestinal:  Negative for constipation, diarrhea, nausea and vomiting.  Genitourinary:  Negative for dysuria.  Neurological:  Negative for headaches.  Psychiatric/Behavioral:  Negative for hallucinations and suicidal ideas.    Blood pressure 135/80, pulse 100, temperature 97.9 F (36.6 C), temperature source Oral, resp. rate  20, SpO2 99%. There is no height or weight on file to calculate BMI.  Treatment Plan Summary: Daily contact with patient to assess and evaluate symptoms and progress in treatment and Medication management   Patient has significant psychiatric history but does not want to restart medications.  Patient is not not harm to self or others at this time.     Safety and Monitoring: Voluntary admission to inpatient psychiatric unit for safety,  stabilization and treatment Daily contact with patient to assess and evaluate symptoms and progress in treatment Patient's case to be discussed in multi-disciplinary team meeting Observation Level : q15 minute checks Vital signs: q12 hours Precautions: Safety   Long Term Goal(s): Improvement in symptoms so as ready for discharge   Short Term Goals: Ability to identify changes in lifestyle to reduce recurrence of condition will improve, Ability to verbalize feelings will improve, Ability to disclose and discuss suicidal ideas, Ability to demonstrate self-control will improve, Ability to identify and develop effective coping behaviors will improve, Ability to maintain clinical measurements within normal limits will improve, Compliance with prescribed medications will improve, and Ability to identify triggers associated with substance abuse/mental health issues will improve   Psychiatric medications: -started lamictal  25mg  PO daily for bipolar disorder -added melatonin for sleep    PRNS -Continue Tylenol  650 mg every 6 hours PRN for mild pain -Continue Maalox 30 mg every 4 hrs PRN for indigestion -Continue Milk of Magnesia as needed every 6 hrs for constipation -Continue trazodone  once at bedtime for insomnia -Continue nicotine  gum as needed -Continue Ativan  1 mg for CIWA >10 -Continue ibuprofen  40 mg once every 6 hours as needed for headache and moderate pain -Continue Imodium  as needed for diarrhea -Continue hydroxy 25 mg as needed for anxiety Continue ondansetron  4 mg as needed for nausea and vomiting   Discharge Planning: Social work and case management to assist with discharge planning and identification of hospital follow-up needs prior to discharge Estimated LOS: 5-7 days Discharge Concerns: Need to establish a safety plan; Medication compliance and effectiveness Discharge Goals: Return home with outpatient referrals for mental health follow-up including medication  management/psychotherapy  Corean Anette Potters, MD 02/26/2024 1:34 PM

## 2024-02-26 NOTE — ED Notes (Signed)
 Patient is quiet, calm and cooperative. However he has issues of insomnia. He denied SI/HI. Denied audiovisual hallucination. His eating is adequate. BM remains as usual. He interacts with peers and staff. He watches TV. Denied N/V nor sweating reported. Med compliant and redirectable patient. No an issue with him.

## 2024-02-26 NOTE — ED Notes (Signed)
 Patient A&Ox4. Denies intent to harm self/others when asked. Denies A/VH. Patient denies any physical complaints when asked. No acute distress noted. Support and encouragement provided. Routine safety checks conducted according to facility protocol. Encouraged patient to notify staff if thoughts of harm toward self or others arise. Patient verbalize understanding and agreement. Will continue to monitor for safety.

## 2024-02-26 NOTE — ED Notes (Signed)
 After taking one time order of Trazodone  50 mg he fell asleep. Milieu is quiet no issue at this time.

## 2024-02-27 DIAGNOSIS — F1090 Alcohol use, unspecified, uncomplicated: Secondary | ICD-10-CM

## 2024-02-27 DIAGNOSIS — F1220 Cannabis dependence, uncomplicated: Secondary | ICD-10-CM | POA: Diagnosis not present

## 2024-02-27 DIAGNOSIS — R079 Chest pain, unspecified: Secondary | ICD-10-CM | POA: Diagnosis not present

## 2024-02-27 DIAGNOSIS — F1410 Cocaine abuse, uncomplicated: Secondary | ICD-10-CM | POA: Diagnosis not present

## 2024-02-27 MED ORDER — DOXEPIN HCL 10 MG PO CAPS
10.0000 mg | ORAL_CAPSULE | Freq: Every day | ORAL | Status: DC
  Administered 2024-02-27: 10 mg via ORAL
  Filled 2024-02-27: qty 7
  Filled 2024-02-27: qty 1
  Filled 2024-02-27: qty 7

## 2024-02-27 NOTE — Group Note (Signed)
 Group Topic: Communication  Group Date: 02/26/2024 Start Time: 0900 End Time: 1020 Facilitators: Herold Lajuana NOVAK, RN  Department: [facility]  Number of Participants: 9  Group Focus: leisure skills Treatment Modality:  Individual Therapy Interventions utilized were patient education Purpose: increase insight  Name: Kevin Gentry Date of Birth: [birth year]  MR: [id number]    Level of Participation: active Quality of Participation: cooperative Interactions with others: gave feedback Mood/Affect: appropriate Triggers (if applicable): none identified Cognition: coherent/clear Progress: Gaining insight Response: pt verbalized understanding of indication of medications given Plan: patient will be encouraged to continue med and tx compliance   Patients Problems:  Patient Active Problem List   Diagnosis Date Noted   Alcohol use disorder, severe, dependence (HCC) 02/23/2024   Substance induced mood disorder (HCC) 02/22/2024   Alcohol abuse 06/30/2023   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

## 2024-02-27 NOTE — ED Notes (Signed)
 Pt sleeping in no acute distress. RR even and unlabored. Environment secured. Will continue to monitor for safety.

## 2024-02-27 NOTE — ED Notes (Signed)
 Pt has been calm and cooperative, spent some of his time in his room this evening, did not attend the evening group. He came out after group, went outside for fresh air and interacted appropriately with peers and staff. Pt denied having any complaints. Mood and affect are appropriate. He took his bedtime medications, denied having any withdrawal symptoms. Staff will continue to monitor pt for safety.

## 2024-02-27 NOTE — ED Notes (Addendum)
 Pt sleeping in no acute distress. RR even and unlabored. Environment secured. Will continue to monitor for safety.

## 2024-02-27 NOTE — ED Provider Notes (Signed)
 Behavioral Health Progress Note  Date and Time: 02/27/2024 12:56 PM Name: Kevin Gentry MRN:  [id number]  Subjective:  Kevin Gentry was seen in the common area on rounds today. He is still having difficulties with sleep but appetite and energy are okay. He has headaches off and on. He still has the sweats. Otherwise no new physical complaints. He has no unmet needs at this time.   Diagnosis:  Final diagnoses:  Alcohol use disorder  Cocaine use disorder (HCC)  Tetrahydrocannabinol (THC) use disorder, severe, dependence (HCC)    Total Time spent with patient: 15 minutes   Past Psychiatric History: MDD v ?bipolar disorder?, GAD, no hosp, no SA. Previously on Depakote, Wellbutrin, Seroquel but stopped these 5 years ago due to side effects. Past Medical History: Denies any medical diagnoses Family History: No pertinent Family Psychiatric  History: Denies Social History: resides with the mother of his children, who are ages 16, 50, and 5. Reports that he currently does not work, but receives SSI income due to his bipolar disorder. his father is his emergency contact person, provided them as Kevin Gentry at [phone]. reports highest level of education as high school, states that he is single   Sleep: Poor  Appetite:  Good  Current Medications:  Current Facility-Administered Medications  Medication Dose Route Frequency Provider Last Rate Last Admin   acetaminophen  (TYLENOL ) tablet 650 mg  650 mg Oral Q6H PRN McCarty, Artie, MD   650 mg at 02/26/24 1554   alum & mag hydroxide-simeth (MAALOX/MYLANTA) 200-200-20 MG/5ML suspension 30 mL  30 mL Oral Q4H PRN McCarty, Artie, MD       haloperidol  (HALDOL ) tablet 5 mg  5 mg Oral TID PRN McCarty, Artie, MD       And   diphenhydrAMINE  (BENADRYL ) capsule 50 mg  50 mg Oral TID PRN McCarty, Artie, MD       haloperidol  lactate (HALDOL ) injection 5 mg  5 mg Intramuscular TID PRN McCarty, Artie, MD       And   diphenhydrAMINE  (BENADRYL ) injection 50 mg  50 mg  Intramuscular TID PRN McCarty, Artie, MD       And   LORazepam  (ATIVAN ) injection 2 mg  2 mg Intramuscular TID PRN McCarty, Artie, MD       haloperidol  lactate (HALDOL ) injection 10 mg  10 mg Intramuscular TID PRN McCarty, Artie, MD       And   diphenhydrAMINE  (BENADRYL ) injection 50 mg  50 mg Intramuscular TID PRN McCarty, Artie, MD       And   LORazepam  (ATIVAN ) injection 2 mg  2 mg Intramuscular TID PRN McCarty, Artie, MD       doxepin  (SINEQUAN ) capsule 10 mg  10 mg Oral QHS Perrin Eddleman, Corean Massa, MD       ibuprofen  (ADVIL ) tablet 400 mg  400 mg Oral Q6H PRN Cole Kandi BROCKS, MD   400 mg at 02/25/24 1128   lamoTRIgine  (LAMICTAL ) tablet 25 mg  25 mg Oral Daily Rithik Odea, Corean Massa, MD   25 mg at 02/27/24 0911   magnesium  hydroxide (MILK OF MAGNESIA) suspension 30 mL  30 mL Oral Daily PRN McCarty, Artie, MD       melatonin tablet 5 mg  5 mg Oral QHS Tamikia Chowning, Corean Massa, MD   5 mg at 02/26/24 2159   multivitamin with minerals tablet 1 tablet  1 tablet Oral Daily McCarty, Artie, MD   1 tablet at 02/27/24 0911   nicotine  polacrilex (NICORETTE ) gum 2 mg  2 mg Oral PRN McCarty, Artie, MD   2 mg at 02/27/24 1241   thiamine  (VITAMIN B1) tablet 100 mg  100 mg Oral Daily McCarty, Artie, MD   100 mg at 02/27/24 0911   No current outpatient medications on file.    Labs  Lab Results:  Admission on 02/22/2024, Discharged on 02/22/2024  Component Date Value Ref Range Status   WBC 02/22/2024 10.5  4.0 - 10.5 K/uL Final   RBC 02/22/2024 4.58  4.22 - 5.81 MIL/uL Final   Hemoglobin 02/22/2024 13.0  13.0 - 17.0 g/dL Final   HCT 91/87/7974 39.1  39.0 - 52.0 % Final   MCV 02/22/2024 85.4  80.0 - 100.0 fL Final   MCH 02/22/2024 28.4  26.0 - 34.0 pg Final   MCHC 02/22/2024 33.2  30.0 - 36.0 g/dL Final   RDW 91/87/7974 12.9  11.5 - 15.5 % Final   Platelets 02/22/2024 290  150 - 400 K/uL Final   nRBC 02/22/2024 0.0  0.0 - 0.2 % Final   Neutrophils Relative % 02/22/2024 69  % Final   Neutro Abs  02/22/2024 7.2  1.7 - 7.7 K/uL Final   Lymphocytes Relative 02/22/2024 18  % Final   Lymphs Abs 02/22/2024 1.8  0.7 - 4.0 K/uL Final   Monocytes Relative 02/22/2024 10  % Final   Monocytes Absolute 02/22/2024 1.1 (H)  0.1 - 1.0 K/uL Final   Eosinophils Relative 02/22/2024 2  % Final   Eosinophils Absolute 02/22/2024 0.2  0.0 - 0.5 K/uL Final   Basophils Relative 02/22/2024 0  % Final   Basophils Absolute 02/22/2024 0.0  0.0 - 0.1 K/uL Final   Immature Granulocytes 02/22/2024 1  % Final   Abs Immature Granulocytes 02/22/2024 0.05  0.00 - 0.07 K/uL Final   Performed at [facility], [address], [place], [place] [address]   Sodium 02/22/2024 141  135 - 145 mmol/L Final   Potassium 02/22/2024 3.5  3.5 - 5.1 mmol/L Final   Chloride 02/22/2024 103  98 - 111 mmol/L Final   CO2 02/22/2024 26  22 - 32 mmol/L Final   Glucose, Bld 02/22/2024 101 (H)  70 - 99 mg/dL Final   Glucose reference range applies only to samples taken after fasting for at least 8 hours.   BUN 02/22/2024 13  6 - 20 mg/dL Final   Creatinine, Ser 02/22/2024 1.08  0.61 - 1.24 mg/dL Final   Calcium 91/87/7974 9.0  8.9 - 10.3 mg/dL Final   Total Protein 91/87/7974 6.7  6.5 - 8.1 g/dL Final   Albumin 91/87/7974 3.9  3.5 - 5.0 g/dL Final   AST 91/87/7974 53 (H)  15 - 41 U/L Final   ALT 02/22/2024 65 (H)  0 - 44 U/L Final   Alkaline Phosphatase 02/22/2024 41  38 - 126 U/L Final   Total Bilirubin 02/22/2024 0.9  0.0 - 1.2 mg/dL Final   GFR, Estimated 02/22/2024 >60  >60 mL/min Final   Comment: (NOTE) Calculated using the CKD-EPI Creatinine Equation (2021)    Anion gap 02/22/2024 12  5 - 15 Final   Performed at [facility], [address], [place], [place] [address]   Hgb A1c MFr Bld 02/22/2024 6.0 (H)  4.8 - 5.6 % Final   Comment: (NOTE)         Prediabetes: 5.7 - 6.4         Diabetes: >6.4         Glycemic control for  adults with diabetes: <7.0    Mean Plasma Glucose 02/22/2024 126  mg/dL Final   Comment:  (NOTE) Performed At: [facility] [address] [place], [place] [id number] Jennette Shorter MD Ey:[id number]    Alcohol, Ethyl (B) 02/22/2024 <15  <15 mg/dL Final   Comment: (NOTE) For medical purposes only. Performed at [facility], [address] [place], [place] [address]    Magnesium  02/22/2024 2.1  1.7 - 2.4 mg/dL Final   Performed at [facility], [address], [place], [place] [address]   Cholesterol 02/22/2024 134  0 - 200 mg/dL Final   Triglycerides 91/87/7974 213 (H)  <150 mg/dL Final   HDL 91/87/7974 46  >40 mg/dL Final   Total CHOL/HDL Ratio 02/22/2024 2.9  RATIO Final   VLDL 02/22/2024 43 (H)  0 - 40 mg/dL Final   LDL Cholesterol 02/22/2024 45  0 - 99 mg/dL Final   Comment:        Total Cholesterol/HDL:CHD Risk Coronary Heart Disease Risk Table                     Men   Women  1/2 Average Risk   3.4   3.3  Average Risk       5.0   4.4  2 X Average Risk   9.6   7.1  3 X Average Risk  23.4   11.0        Use the calculated Patient Ratio above and the CHD Risk Table to determine the patient's CHD Risk.        ATP III CLASSIFICATION (LDL):  <100     mg/dL   Optimal  899-870  mg/dL   Near or Above                    Optimal  130-159  mg/dL   Borderline  839-810  mg/dL   High  >809     mg/dL   Very High Performed at [facility], [address] [place], [place] [address]    TSH 02/22/2024 2.674  0.350 - 4.500 uIU/mL Final   Comment: Performed by a 3rd Generation assay with a functional sensitivity of <=0.01 uIU/mL. Performed at [facility], [address] [place], [place] [address]    Color, Urine 02/22/2024 YELLOW  YELLOW Final   APPearance 02/22/2024 CLEAR  CLEAR Final   Specific Gravity, Urine 02/22/2024 1.014  1.005 - 1.030 Final   pH 02/22/2024 5.0  5.0 - 8.0 Final   Glucose, UA 02/22/2024 NEGATIVE  NEGATIVE mg/dL Final   Hgb urine dipstick 02/22/2024 NEGATIVE  NEGATIVE Final   Bilirubin Urine 02/22/2024 NEGATIVE   NEGATIVE Final   Ketones, ur 02/22/2024 5 (A)  NEGATIVE mg/dL Final   Protein, ur 91/87/7974 30 (A)  NEGATIVE mg/dL Final   Nitrite 91/87/7974 NEGATIVE  NEGATIVE Final   Leukocytes,Ua 02/22/2024 NEGATIVE  NEGATIVE Final   RBC / HPF 02/22/2024 0-5  0 - 5 RBC/hpf Final   WBC, UA 02/22/2024 0-5  0 - 5 WBC/hpf Final   Bacteria, UA 02/22/2024 NONE SEEN  NONE SEEN Final   Squamous Epithelial / HPF 02/22/2024 0-5  0 - 5 /HPF Final   Mucus 02/22/2024 PRESENT   Final   Performed at [facility], [address], [place], [place] [address]   POC Amphetamine UR 02/22/2024 None Detected  NONE DETECTED (Cut Off Level 1000 ng/mL) Final   POC Secobarbital (  BAR) 02/22/2024 None Detected  NONE DETECTED (Cut Off Level 300 ng/mL) Final   POC Buprenorphine (BUP) 02/22/2024 None Detected  NONE DETECTED (Cut Off Level 10 ng/mL) Final   POC Oxazepam (BZO) 02/22/2024 None Detected  NONE DETECTED (Cut Off Level 300 ng/mL) Final   POC Cocaine UR 02/22/2024 Positive (A)  NONE DETECTED (Cut Off Level 300 ng/mL) Final   POC Methamphetamine UR 02/22/2024 None Detected  NONE DETECTED (Cut Off Level 1000 ng/mL) Final   POC Morphine  02/22/2024 None Detected  NONE DETECTED (Cut Off Level 300 ng/mL) Final   POC Methadone UR 02/22/2024 None Detected  NONE DETECTED (Cut Off Level 300 ng/mL) Final   POC Oxycodone  UR 02/22/2024 None Detected  NONE DETECTED (Cut Off Level 100 ng/mL) Final   POC Marijuana UR 02/22/2024 Positive (A)  NONE DETECTED (Cut Off Level 50 ng/mL) Final  Admission on 02/21/2024, Discharged on 02/22/2024  Component Date Value Ref Range Status   Sodium 02/21/2024 140  135 - 145 mmol/L Final   Potassium 02/21/2024 3.8  3.5 - 5.1 mmol/L Final   Chloride 02/21/2024 103  98 - 111 mmol/L Final   CO2 02/21/2024 21 (L)  22 - 32 mmol/L Final   Glucose, Bld 02/21/2024 111 (H)  70 - 99 mg/dL Final   Glucose reference range applies only to samples taken after fasting for at least 8 hours.   BUN 02/21/2024 13  6  - 20 mg/dL Final   Creatinine, Ser 02/21/2024 1.25 (H)  0.61 - 1.24 mg/dL Final   Calcium 91/88/7974 9.2  8.9 - 10.3 mg/dL Final   GFR, Estimated 02/21/2024 >60  >60 mL/min Final   Comment: (NOTE) Calculated using the CKD-EPI Creatinine Equation (2021)    Anion gap 02/21/2024 16 (H)  5 - 15 Final   Performed at [facility], [address] [place], [place] [address]   WBC 02/21/2024 16.9 (H)  4.0 - 10.5 K/uL Final   RBC 02/21/2024 4.62  4.22 - 5.81 MIL/uL Final   Hemoglobin 02/21/2024 13.1  13.0 - 17.0 g/dL Final   HCT 91/88/7974 40.5  39.0 - 52.0 % Final   MCV 02/21/2024 87.7  80.0 - 100.0 fL Final   MCH 02/21/2024 28.4  26.0 - 34.0 pg Final   MCHC 02/21/2024 32.3  30.0 - 36.0 g/dL Final   RDW 91/88/7974 13.0  11.5 - 15.5 % Final   Platelets 02/21/2024 306  150 - 400 K/uL Final   nRBC 02/21/2024 0.0  0.0 - 0.2 % Final   Performed at [facility], [address], [place], [place] [address]   Troponin I (High Sensitivity) 02/21/2024 5  <18 ng/L Final   Comment: (NOTE) Elevated high sensitivity troponin I (hsTnI) values and significant  changes across serial measurements may suggest ACS but many other  chronic and acute conditions are known to elevate hsTnI results.  Refer to the Links section for chest pain algorithms and additional  guidance. Performed at [facility], [address] [place], [place] [address]    Troponin I (High Sensitivity) 02/21/2024 6  <18 ng/L Final   Comment: (NOTE) Elevated high sensitivity troponin I (hsTnI) values and significant  changes across serial measurements may suggest ACS but many other  chronic and acute conditions are known to elevate hsTnI results.  Refer to the Links section for chest pain algorithms and additional  guidance. Performed at [facility], [address] [place], [place] [address]  Blood Alcohol level:  Lab Results  Component Value Date   [facility] <15 02/22/2024   ETH <10 06/30/2023     Metabolic Disorder Labs: Lab Results  Component Value Date   HGBA1C 6.0 (H) 02/22/2024   MPG 126 02/22/2024   No results found for: PROLACTIN Lab Results  Component Value Date   CHOL 134 02/22/2024   TRIG 213 (H) 02/22/2024   HDL 46 02/22/2024   CHOLHDL 2.9 02/22/2024   VLDL 43 (H) 02/22/2024   LDLCALC 45 02/22/2024   LDLCALC 71 04/03/2010    Therapeutic Lab Levels: No results found for: LITHIUM No results found for: VALPROATE No results found for: CBMZ  Physical Findings   PHQ2-9    Flowsheet Row ED from 02/22/2024 in [facility] Most recent reading at 02/22/2024  7:32 PM ED from 06/30/2023 in [facility] Most recent reading at 07/01/2023  8:08 AM ED from 06/30/2023 in [facility] Most recent reading at 06/30/2023 10:47 PM  PHQ-2 Total Score 4 4 4   PHQ-9 Total Score 16 11 11    Flowsheet Row ED from 02/22/2024 in [facility] Most recent reading at 02/22/2024 10:41 AM ED from 02/22/2024 in [facility] Most recent reading at 02/22/2024  8:12 AM ED from 02/21/2024 in [facility] Emergency Department at [facility] Most recent reading at 02/21/2024  8:17 PM  C-SSRS RISK CATEGORY No Risk No Risk No Risk     Musculoskeletal  Strength & Muscle Tone: within normal limits Gait & Station: normal Patient leans: N/A  Psychiatric Specialty Exam  Presentation  General Appearance:  Casual; Fairly Groomed  Eye Contact: Fair  Speech: Normal Rate  Speech Volume: Normal  Handedness: Right   Mood and Affect  Mood: Euthymic  Affect: Appropriate   Thought Process  Thought Processes: Linear  Descriptions of Associations:Intact  Orientation:Full (Time, Place and Person)  Thought Content:Logical  Diagnosis of Schizophrenia or Schizoaffective disorder in past: No    Hallucinations:Hallucinations:  None  Ideas of Reference:None  Suicidal Thoughts:Suicidal Thoughts: No  Homicidal Thoughts:Homicidal Thoughts: No   Sensorium  Memory: Immediate Good; Recent Fair  Judgment: Fair  Insight: Fair   Art therapist  Concentration: Fair  Attention Span: Fair  Recall: Good  Fund of Knowledge: Good  Language: Good   Psychomotor Activity  Psychomotor Activity: Psychomotor Activity: Normal   Assets  Assets: Communication Skills; Desire for Improvement; Leisure Time   Sleep  Sleep: Sleep: Poor  Estimated Sleeping Duration (Last 24 Hours): 10.25-12.00 hours  No data recorded  Physical Exam  Physical Exam ROS Blood pressure 122/82, pulse 80, temperature 98.3 F (36.8 C), resp. rate 18, SpO2 100%. There is no height or weight on file to calculate BMI.  Treatment Plan Summary: Daily contact with patient to assess and evaluate symptoms and progress in treatment and Medication management   Patient has significant psychiatric history but does not want to restart medications.  Patient is not not harm to self or others at this time.     Safety and Monitoring: Voluntary admission to inpatient psychiatric unit for safety, stabilization and treatment Daily contact with patient to assess and evaluate symptoms and progress in treatment Patient's case to be discussed in multi-disciplinary team meeting Observation Level : q15 minute checks Vital signs: q12 hours Precautions: Safety   Long Term Goal(s): Improvement in symptoms so as ready for discharge   Short Term Goals: Ability to identify changes  in lifestyle to reduce recurrence of condition will improve, Ability to verbalize feelings will improve, Ability to disclose and discuss suicidal ideas, Ability to demonstrate self-control will improve, Ability to identify and develop effective coping behaviors will improve, Ability to maintain clinical measurements within normal limits will improve, Compliance with  prescribed medications will improve, and Ability to identify triggers associated with substance abuse/mental health issues will improve   Psychiatric medications: -started lamictal  25mg  PO daily for bipolar disorder -added melatonin for sleep -stopped trazodone . Scheduled doxepin  10mg  PO at bedtime for sleep and anxiety    PRNS -Continue Tylenol  650 mg every 6 hours PRN for mild pain -Continue Maalox 30 mg every 4 hrs PRN for indigestion -Continue Milk of Magnesia as needed every 6 hrs for constipation -Continue trazodone  once at bedtime for insomnia -Continue nicotine  gum as needed -Continue Ativan  1 mg for CIWA >10 -Continue ibuprofen  40 mg once every 6 hours as needed for headache and moderate pain -Continue Imodium  as needed for diarrhea -Continue hydroxy 25 mg as needed for anxiety Continue ondansetron  4 mg as needed for nausea and vomiting   Discharge Planning: Social work and case management to assist with discharge planning and identification of hospital follow-up needs prior to discharge Estimated LOS: 5-7 days Discharge Concerns: Need to establish a safety plan; Medication compliance and effectiveness Discharge Goals: Return home with outpatient referrals for mental health follow-up including medication management/psychotherapy  Corean Anette Potters, MD 02/27/2024 12:56 PM

## 2024-02-27 NOTE — Group Note (Signed)
 Group Topic: Healthy Self Image and Positive Change  Group Date: 02/27/2024 Start Time: 1200 End Time: 1230 Facilitators: Herold Lajuana NOVAK, RN  Department: [facility]  Number of Participants: 7  Group Focus: coping skills Treatment Modality:  Individual Therapy Interventions utilized were leisure development and problem solving Purpose: relapse prevention strategies  Name: CASY BRUNETTO Date of Birth: [birth year]  MR: [id number]    Level of Participation: active Quality of Participation: attentive Interactions with others: gave feedback Mood/Affect: appropriate Triggers (if applicable): none identified Cognition: coherent/clear Progress: Significant Response: when I think about using and can't get to a drink, I look at the tv to get my mind off of it. I just find something good to watch Plan: patient will be encouraged to utilize learned coping skills to manage cravings  Patients Problems:  Patient Active Problem List   Diagnosis Date Noted   Alcohol use disorder, severe, dependence (HCC) 02/23/2024   Substance induced mood disorder (HCC) 02/22/2024   Alcohol abuse 06/30/2023   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

## 2024-02-27 NOTE — Group Note (Signed)
 Group Topic: Wellness  Group Date: 02/27/2024 Start Time: 1930 End Time: 2000 Facilitators: Verdon Jacqualyn BRAVO, NT  Department: [facility]  Number of Participants: 8  Group Focus: daily focus Treatment Modality:  Individual Therapy Interventions utilized were group exercise Purpose: reinforce self-care  Name: BARRE AYDELOTT Date of Birth: 04/11/1986  MR: [id number]    Level of Participation: active Quality of Participation: cooperative Interactions with others: gave feedback Mood/Affect: appropriate Triggers (if applicable): n/a Cognition: coherent/clear Progress: Moderate Response: self care-  I am feeling depleted because I need to get clothes from dads house, shoes, get numbers out my phone, can't get family on phone, what will make me happy is assistance with food, housing, rehabilitation, my must do's are to stay clean, find housing, get back on meds,maintain stability, I can fit self care in all the time Plan: follow-up needed  Patients Problems:  Patient Active Problem List   Diagnosis Date Noted   Alcohol use disorder, severe, dependence (HCC) 02/23/2024   Substance induced mood disorder (HCC) 02/22/2024   Alcohol abuse 06/30/2023   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

## 2024-02-27 NOTE — ED Notes (Signed)
 Pt sitting in dayroom watching television and interacting with peers. No acute distress noted. No concerns voiced. Informed pt to notify staff with any needs or assistance. Pt verbalized understanding and agreement. Will continue to monitor for safety.

## 2024-02-27 NOTE — ED Notes (Signed)
 No an issue with this patient. He is calm, quiet and cooperative. Denied SI/HI and audiovisual hallucination. No withdrawal symptoms. He suffers from insomnia in spite of sleeping medications he gets.Nutrition intake adequate. BM remains as usual. Med compliant.

## 2024-02-27 NOTE — Group Note (Signed)
 Group Topic: Relaxation  Group Date: 02/27/2024 Start Time: 1315 End Time: 1400 Facilitators: Laneta Renea POUR, NT  Department: [facility]  Number of Participants: 3  Group Focus: coping skills Treatment Modality:  Psychoeducation Interventions utilized were leisure development Purpose: enhance coping skills and increase insight  Name: EULAN HEYWARD Date of Birth: Feb 16, 1986  MR: [id number]    Level of Participation: active Quality of Participation: attentive and engaged Interactions with others: gave feedback Mood/Affect: appropriate and bright Triggers (if applicable): N/A Cognition: coherent/clear and insightful Progress: Gaining insight Response: wants to travel more as a recovery gold Plan: follow-up needed  Patients Problems:  Patient Active Problem List   Diagnosis Date Noted   Alcohol use disorder, severe, dependence (HCC) 02/23/2024   Substance induced mood disorder (HCC) 02/22/2024   Alcohol abuse 06/30/2023   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

## 2024-02-28 DIAGNOSIS — R079 Chest pain, unspecified: Secondary | ICD-10-CM | POA: Diagnosis not present

## 2024-02-28 DIAGNOSIS — F1410 Cocaine abuse, uncomplicated: Secondary | ICD-10-CM | POA: Diagnosis not present

## 2024-02-28 DIAGNOSIS — F1220 Cannabis dependence, uncomplicated: Secondary | ICD-10-CM | POA: Diagnosis not present

## 2024-02-28 DIAGNOSIS — F1090 Alcohol use, unspecified, uncomplicated: Secondary | ICD-10-CM | POA: Diagnosis not present

## 2024-02-28 MED ORDER — ADULT MULTIVITAMIN W/MINERALS CH: 1.0000 | ORAL_TABLET | Freq: Every day | ORAL | 0 refills | Status: AC

## 2024-02-28 MED ORDER — DOXEPIN HCL 10 MG PO CAPS: 10.0000 mg | ORAL_CAPSULE | Freq: Every day | ORAL | 0 refills | Status: AC

## 2024-02-28 MED ORDER — NICOTINE 21 MG/24HR TD PT24: MEDICATED_PATCH | TRANSDERMAL | 0 refills | Status: AC

## 2024-02-28 MED ORDER — LAMOTRIGINE 25 MG PO TABS: 25.0000 mg | ORAL_TABLET | Freq: Every day | ORAL | 0 refills | Status: AC

## 2024-02-28 MED ORDER — MELATONIN 5 MG PO TABS
5.0000 mg | ORAL_TABLET | Freq: Every day | ORAL | 0 refills | Status: AC
Start: 2024-02-28 — End: 2024-03-29

## 2024-02-28 NOTE — Group Note (Signed)
 Group Topic: Change and Accountability  Group Date: 02/28/2024 Start Time: 0800 End Time: 0845 Facilitators: Lonzell Dwayne RAMAN, NT  Department: [facility]  Number of Participants: 4  Group Focus: personal responsibility Treatment Modality:  Psychoeducation Interventions utilized were patient education Purpose: express feelings  Name: Kevin Gentry Date of Birth: Aug 15, 1985  MR: [id number]    Level of Participation: Patient did not attend group Quality of Participation: N/A Interactions with others: N/A Mood/Affect: N/A Triggers (if applicable): N/A Cognition: N/A Progress: N/A Response: N/A Plan: N/A   Patients Problems:  Patient Active Problem List   Diagnosis Date Noted   Alcohol use disorder, severe, dependence (HCC) 02/23/2024   Substance induced mood disorder (HCC) 02/22/2024   Alcohol abuse 06/30/2023   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

## 2024-02-28 NOTE — ED Notes (Signed)
 The milieu is quiet and the patient also sleeping

## 2024-02-28 NOTE — ED Provider Notes (Signed)
 [facility]BC/OBS ASAP Discharge Summary  Date and Time: 02/28/2024 3:57 PM  Name: Kevin Gentry  MRN:  [id number]   Discharge Diagnoses:  Final diagnoses:  Alcohol use disorder  Cocaine use disorder (HCC)  Tetrahydrocannabinol (THC) use disorder, severe, dependence (HCC)    Subjective:  Patient reports that his mood is fine. He has a difficult time getting to sleep. He says the medication he took, took 2 hrs to kick in.  Pt reports that he does not have a place to go. He wants to go to sober living or residential treatment. He is accepted to [facility] but in 9 days. He complains of a headache and cannot discuss other matters due to the headache. Later he told the nurse that he was ready to leave today.   Stay Summary: Gradually, patient started adjusting to milieu. The patient was evaluated each day by a clinical provider to ascertain response to treatment. Improvement was noted by the patient's report of decreasing symptoms, improved sleep and appetite, affect, medication tolerance, behavior, and participation in unit programming. Patient was asked each day to complete a self inventory noting mood, mental status, pain, new symptoms, anxiety and concerns.   Symptoms were reported as significantly decreased  discharge. Pt had an uncomplicated course of treatment with usual medication protocols for reducing withdrawal symptoms.   Labs were reviewed with the patient, and abnormal results were discussed with the patient.  Total Time spent with patient: 20 minutes  Past Psychiatric History: MDD v ?bipolar disorder?, GAD, no hosp, no SA. Previously on Depakote, Wellbutrin, Seroquel but stopped these 5 years ago due to side effects. Past Medical History: Denies any medical diagnoses Family History: No pertinent, Pt not willing to discuss family Family Psychiatric  History: Denies Social History: resides with the mother of his children, who are ages 23, 100, and 5. Reports that he currently does not work, but  receives SSI income due to his bipolar disorder. his father is his emergency contact person, provided them as Ozell Bihari at [phone]. reports highest level of education as high school, states that he is single  Today pt indicates he is homeless and wants to go to a residential or sober living situation. Tobacco Cessation:  A prescription for an FDA-approved tobacco cessation medication provided at discharge  Current Medications:  Current Facility-Administered Medications  Medication Dose Route Frequency Provider Last Rate Last Admin   acetaminophen  (TYLENOL ) tablet 650 mg  650 mg Oral Q6H PRN McCarty, Artie, MD   650 mg at 02/28/24 1130   alum & mag hydroxide-simeth (MAALOX/MYLANTA) 200-200-20 MG/5ML suspension 30 mL  30 mL Oral Q4H PRN McCarty, Artie, MD       haloperidol  (HALDOL ) tablet 5 mg  5 mg Oral TID PRN McCarty, Artie, MD       And   diphenhydrAMINE  (BENADRYL ) capsule 50 mg  50 mg Oral TID PRN McCarty, Artie, MD       haloperidol  lactate (HALDOL ) injection 5 mg  5 mg Intramuscular TID PRN McCarty, Artie, MD       And   diphenhydrAMINE  (BENADRYL ) injection 50 mg  50 mg Intramuscular TID PRN McCarty, Artie, MD       And   LORazepam  (ATIVAN ) injection 2 mg  2 mg Intramuscular TID PRN McCarty, Artie, MD       haloperidol  lactate (HALDOL ) injection 10 mg  10 mg Intramuscular TID PRN McCarty, Artie, MD       And   diphenhydrAMINE  (BENADRYL ) injection 50 mg  50  mg Intramuscular TID PRN McCarty, Artie, MD       And   LORazepam  (ATIVAN ) injection 2 mg  2 mg Intramuscular TID PRN McCarty, Artie, MD       doxepin  (SINEQUAN ) capsule 10 mg  10 mg Oral QHS Hill, Corean Massa, MD   10 mg at 02/27/24 2137   ibuprofen  (ADVIL ) tablet 400 mg  400 mg Oral Q6H PRN Cole Kandi BROCKS, MD   400 mg at 02/25/24 1128   lamoTRIgine  (LAMICTAL ) tablet 25 mg  25 mg Oral Daily Leigh Corean Massa, MD   25 mg at 02/28/24 9143   magnesium  hydroxide (MILK OF MAGNESIA) suspension 30 mL  30 mL Oral Daily  PRN McCarty, Artie, MD       melatonin tablet 5 mg  5 mg Oral QHS Hill, Corean Massa, MD   5 mg at 02/27/24 2137   multivitamin with minerals tablet 1 tablet  1 tablet Oral Daily McCarty, Artie, MD   1 tablet at 02/28/24 9142   nicotine  polacrilex (NICORETTE ) gum 2 mg  2 mg Oral PRN McCarty, Artie, MD   2 mg at 02/28/24 1511   thiamine  (VITAMIN B1) tablet 100 mg  100 mg Oral Daily McCarty, Artie, MD   100 mg at 02/28/24 9142   Current Outpatient Medications  Medication Sig Dispense Refill   nicotine  (NICODERM CQ  - DOSED IN MG/24 HOURS) 21 mg/24hr patch Place 1 patch (21 mg total) onto the skin daily for 30 days, THEN 1 patch (21 mg total) daily. 30 patch 0   doxepin  (SINEQUAN ) 10 MG capsule Take 1 capsule (10 mg total) by mouth at bedtime. 30 capsule 0   [START ON 02/29/2024] lamoTRIgine  (LAMICTAL ) 25 MG tablet Take 1 tablet (25 mg total) by mouth daily. 30 tablet 0   melatonin 5 MG TABS Take 1 tablet (5 mg total) by mouth at bedtime. 30 tablet 0   [START ON 02/29/2024] Multiple Vitamin (MULTIVITAMIN WITH MINERALS) TABS tablet Take 1 tablet by mouth daily. 30 tablet 0    PTA Medications:  Facility Ordered Medications  Medication   [COMPLETED] sodium chloride  0.9 % bolus 1,000 mL   acetaminophen  (TYLENOL ) tablet 650 mg   alum & mag hydroxide-simeth (MAALOX/MYLANTA) 200-200-20 MG/5ML suspension 30 mL   magnesium  hydroxide (MILK OF MAGNESIA) suspension 30 mL   haloperidol  (HALDOL ) tablet 5 mg   And   diphenhydrAMINE  (BENADRYL ) capsule 50 mg   haloperidol  lactate (HALDOL ) injection 5 mg   And   diphenhydrAMINE  (BENADRYL ) injection 50 mg   And   LORazepam  (ATIVAN ) injection 2 mg   haloperidol  lactate (HALDOL ) injection 10 mg   And   diphenhydrAMINE  (BENADRYL ) injection 50 mg   And   LORazepam  (ATIVAN ) injection 2 mg   thiamine  (VITAMIN B1) tablet 100 mg   multivitamin with minerals tablet 1 tablet   [EXPIRED] LORazepam  (ATIVAN ) tablet 1 mg   [EXPIRED] hydrOXYzine  (ATARAX ) tablet 25  mg   [EXPIRED] loperamide  (IMODIUM ) capsule 2-4 mg   [EXPIRED] ondansetron  (ZOFRAN -ODT) disintegrating tablet 4 mg   nicotine  polacrilex (NICORETTE ) gum 2 mg   ibuprofen  (ADVIL ) tablet 400 mg   lamoTRIgine  (LAMICTAL ) tablet 25 mg   [COMPLETED] traZODone  (DESYREL ) tablet 50 mg   melatonin tablet 5 mg   doxepin  (SINEQUAN ) capsule 10 mg   PTA Medications  Medication Sig   nicotine  (NICODERM CQ  - DOSED IN MG/24 HOURS) 21 mg/24hr patch Place 1 patch (21 mg total) onto the skin daily for 30 days, THEN 1 patch (21  mg total) daily.   doxepin  (SINEQUAN ) 10 MG capsule Take 1 capsule (10 mg total) by mouth at bedtime.   [START ON 02/29/2024] Multiple Vitamin (MULTIVITAMIN WITH MINERALS) TABS tablet Take 1 tablet by mouth daily.   melatonin 5 MG TABS Take 1 tablet (5 mg total) by mouth at bedtime.   [START ON 02/29/2024] lamoTRIgine  (LAMICTAL ) 25 MG tablet Take 1 tablet (25 mg total) by mouth daily.       02/28/2024    3:17 PM 02/22/2024    7:32 PM 07/01/2023    8:08 AM  Depression screen PHQ 2/9  Decreased Interest 2 2 3   Down, Depressed, Hopeless 0 2 1  PHQ - 2 Score 2 4 4   Altered sleeping 3 2 1   Tired, decreased energy 1 2 0  Change in appetite 1 2 1   Feeling bad or failure about yourself  0 2 3  Trouble concentrating 1 2 0  Moving slowly or fidgety/restless 1 2 2   Suicidal thoughts 0  0  PHQ-9 Score 9 16 11   Difficult doing work/chores Very difficult Somewhat difficult     Flowsheet Row ED from 02/22/2024 in [facility] Most recent reading at 02/22/2024 10:41 AM ED from 02/22/2024 in [facility] Most recent reading at 02/22/2024  8:12 AM ED from 02/21/2024 in [facility] Emergency Department at [facility] Most recent reading at 02/21/2024  8:17 PM  C-SSRS RISK CATEGORY No Risk No Risk No Risk    Musculoskeletal  Strength & Muscle Tone: within normal limits Gait & Station: normal Patient leans: N/A  Psychiatric  Specialty Exam  Presentation  General Appearance:  Fairly Groomed  Eye Contact: Fair  Speech: Slow  Speech Volume: Decreased  Handedness: Right   Mood and Affect  Mood: Dysphoric  Affect: Congruent   Thought Process  Thought Processes: Coherent  Descriptions of Associations:Intact  Orientation:Full (Time, Place and Person)  Thought Content:Logical  Diagnosis of Schizophrenia or Schizoaffective disorder in past: No    Hallucinations:Hallucinations: None  Ideas of Reference:None  Suicidal Thoughts:Suicidal Thoughts: No  Homicidal Thoughts:Homicidal Thoughts: No   Sensorium  Memory: Immediate Good; Recent Good; Remote Good  Judgment: Fair  Insight: Fair   Chartered certified accountant: Fair  Attention Span: Fair  Recall: Good  Fund of Knowledge: Good  Language: Good   Psychomotor Activity  Psychomotor Activity: Psychomotor Activity: Normal   Assets  Assets: Communication Skills; Desire for Improvement   Sleep  Sleep: Sleep: Fair  Estimated Sleeping Duration (Last 24 Hours): 9.00-10.75 hours  No data recorded  Physical Exam  Physical Exam Vitals and nursing note reviewed.  Constitutional:      Appearance: Normal appearance.  HENT:     Head: Normocephalic and atraumatic.     Nose: Nose normal.     Mouth/Throat:     Mouth: Mucous membranes are moist.  Eyes:     Pupils: Pupils are equal, round, and reactive to light.  Pulmonary:     Effort: Pulmonary effort is normal.  Musculoskeletal:        General: Normal range of motion.     Cervical back: Normal range of motion.  Skin:    General: Skin is warm and dry.  Neurological:     General: No focal deficit present.  Psychiatric:        Behavior: Behavior normal.     Review of Systems  Constitutional:  Positive for malaise/fatigue. Negative for chills, fever and weight loss.  HENT:  Negative for hearing loss.   Eyes:  Negative for blurred vision.   Respiratory:  Negative for cough.   Cardiovascular:  Negative for chest pain.  Gastrointestinal:  Negative for heartburn and nausea.  Genitourinary:  Negative for dysuria and urgency.  Musculoskeletal:  Negative for back pain and joint pain.  Neurological:  Positive for headaches.  Psychiatric/Behavioral:  Positive for depression and substance abuse. Negative for hallucinations and suicidal ideas. The patient has insomnia.    Blood pressure 127/84, pulse 100, temperature 98.4 F (36.9 C), temperature source Oral, resp. rate 18, SpO2 98%. There is no height or weight on file to calculate BMI. Blood pressure 127/84, pulse 100, temperature 98.4 F (36.9 C), temperature source Oral, resp. rate 18, SpO2 98%. There is no height or weight on file to calculate BMI.  Demographic Factors:  Male and Low socioeconomic status  Loss Factors: Decrease in vocational status and Financial problems/change in socioeconomic status  Historical Factors: Impulsivity  Risk Reduction Factors:   Sense of responsibility to family  Continued Clinical Symptoms:  Alcohol/Substance Abuse/Dependencies  Cognitive Features That Contribute To Risk:  Loss of executive function    Suicide Risk:  Moderate:  Frequent suicidal ideation with limited intensity, and duration, some specificity in terms of plans, no associated intent, good self-control, limited dysphoria/symptomatology, some risk factors present, and identifiable protective factors, including available and accessible social support.  Plan Of Care/Follow-up recommendations:  The patient is able to verbalize their individual safety plan.  1. It is recommended to the patient to continue psychiatric and somatic medications as prescribed, after discharge.  2. It is recommended to the patient to follow up with your outpatient psychiatric provider and PCP.  3. Testing: Follow-up with outpatient provider for abnormal lab results:  4. It was discussed with  the patient, the impact of alcohol, drugs, tobacco have been there overall psychiatric and medical wellbeing, and total abstinence from substance use was recommended.  5. Prescriptions provided or sent directly to preferred pharmacy at discharge. Patient agreeable to plan. Given opportunity to ask questions. Appears to feel comfortable with discharge.  6. Activity: Daily physical activity; reduce sedentary behaviors.  7. Diet: Portion-limited diet rich in produce, whole (minimally processed) grains, nuts/seeds, beans/legumes, Copious water intake. Limit animal and (ultra)processed foods and sugar-sweetened beverages.  8. In the event of worsening symptoms, the patient is instructed to call your outpatient psychiatric provider, 911, [phone] or go to the nearest emergency department for appropriate evaluation and treatment of symptoms.  9. -Recommend total abstinence from alcohol, tobacco, and other illicit drug use at discharge.  10. Patient was discharged with a plan to follow up with [facility].  DISPO: Patient is going home to live with his father until next week when a spot at [facility] opens up for him to go there.  Suicide Risk: Patient has risk factors that put him at chronic suicide risk but he is not an imminent risk to himself.    -If your psychiatric symptoms recur, worsen, or if you have side effects to your psychiatric medications, call your outpatient psychiatric provider, 911, [phone] or go to the nearest emergency department.   -If suicidal thoughts occur, immediately call your outpatient psychiatric provider, 911, [phone] or go to the nearest emergency department.   Disposition: [facility] in 9 days. [facility] will arrange outpatient psychiatry and therapy services.   Garvin JINNY Gaines, MD 02/28/2024, 3:57 PM

## 2024-02-28 NOTE — Discharge Planning (Addendum)
 SW spoke with Emmie Caldron at [facility] and sent her another medication list for patient and she is currently in review of him and will be back in touch with SW very soon.  SW made her aware patient was wanting to DC today if he did not get approved and would wait for bed at [facility] on 8/27 where he is approved.  SW spoke with Emmie Caldron and she scheduled a phone assessment with patient for 3:30 today and will let SW know if approved and can make plans for DC to [facility] tomorrow to arrive at 9:00AM if approved. Will continue to follow.

## 2024-02-28 NOTE — ED Provider Notes (Signed)
 Behavioral Health Progress Note  Date and Time: 02/28/2024 11 am Name: Kevin Gentry MRN:  [id number]  Subjective:  Patient reports that his mood is fine. He has a difficult time getting to sleep. He says the medication he took, took 2 hrs to kick in.  Pt reports that he does not have a place to go. He wants to go to sober living or residential treatment. He is accepted to [facility] but in 9 days. He complains of a headache and cannot discuss other matters due to the headache. Later he told the nurse that he was ready to leave today.   Diagnosis:  Final diagnoses:  Alcohol use disorder  Cocaine use disorder (HCC)  Tetrahydrocannabinol (THC) use disorder, severe, dependence (HCC)    Total Time spent with patient: 20 minutes  Past Psychiatric History: MDD v ?bipolar disorder?, GAD, no hosp, no SA. Previously on Depakote, Wellbutrin, Seroquel but stopped these 5 years ago due to side effects. Past Medical History: Denies any medical diagnoses Family History: No pertinent, Pt not willing to discuss family Family Psychiatric  History: Denies Social History: resides with the mother of his children, who are ages 53, 69, and 5. Reports that he currently does not work, but receives SSI income due to his bipolar disorder. his father is his emergency contact person, provided them as Ozell Bihari at [phone]. reports highest level of education as high school, states that he is single  Today pt indicates he is homeless and wants to go to a residential or sober living situation.  Sleep: Poor  Appetite:  Fair  Current Medications:  Current Facility-Administered Medications  Medication Dose Route Frequency Provider Last Rate Last Admin   acetaminophen  (TYLENOL ) tablet 650 mg  650 mg Oral Q6H PRN McCarty, Artie, MD   650 mg at 02/28/24 1130   alum & mag hydroxide-simeth (MAALOX/MYLANTA) 200-200-20 MG/5ML suspension 30 mL  30 mL Oral Q4H PRN McCarty, Artie, MD       haloperidol  (HALDOL ) tablet 5 mg  5 mg  Oral TID PRN McCarty, Artie, MD       And   diphenhydrAMINE  (BENADRYL ) capsule 50 mg  50 mg Oral TID PRN McCarty, Artie, MD       haloperidol  lactate (HALDOL ) injection 5 mg  5 mg Intramuscular TID PRN McCarty, Artie, MD       And   diphenhydrAMINE  (BENADRYL ) injection 50 mg  50 mg Intramuscular TID PRN McCarty, Artie, MD       And   LORazepam  (ATIVAN ) injection 2 mg  2 mg Intramuscular TID PRN McCarty, Artie, MD       haloperidol  lactate (HALDOL ) injection 10 mg  10 mg Intramuscular TID PRN McCarty, Artie, MD       And   diphenhydrAMINE  (BENADRYL ) injection 50 mg  50 mg Intramuscular TID PRN McCarty, Artie, MD       And   LORazepam  (ATIVAN ) injection 2 mg  2 mg Intramuscular TID PRN McCarty, Artie, MD       doxepin  (SINEQUAN ) capsule 10 mg  10 mg Oral QHS Hill, Corean Massa, MD   10 mg at 02/27/24 2137   ibuprofen  (ADVIL ) tablet 400 mg  400 mg Oral Q6H PRN Cole Kandi BROCKS, MD   400 mg at 02/25/24 1128   lamoTRIgine  (LAMICTAL ) tablet 25 mg  25 mg Oral Daily Leigh Corean Massa, MD   25 mg at 02/28/24 0856   magnesium  hydroxide (MILK OF MAGNESIA) suspension 30 mL  30 mL Oral Daily  PRN McCarty, Artie, MD       melatonin tablet 5 mg  5 mg Oral QHS Hill, Corean Massa, MD   5 mg at 02/27/24 2137   multivitamin with minerals tablet 1 tablet  1 tablet Oral Daily McCarty, Artie, MD   1 tablet at 02/28/24 9142   nicotine  polacrilex (NICORETTE ) gum 2 mg  2 mg Oral PRN McCarty, Artie, MD   2 mg at 02/28/24 1511   thiamine  (VITAMIN B1) tablet 100 mg  100 mg Oral Daily McCarty, Artie, MD   100 mg at 02/28/24 0857   No current outpatient medications on file.    Labs  Lab Results:  Admission on 02/22/2024, Discharged on 02/22/2024  Component Date Value Ref Range Status   WBC 02/22/2024 10.5  4.0 - 10.5 K/uL Final   RBC 02/22/2024 4.58  4.22 - 5.81 MIL/uL Final   Hemoglobin 02/22/2024 13.0  13.0 - 17.0 g/dL Final   HCT 91/87/7974 39.1  39.0 - 52.0 % Final   MCV 02/22/2024 85.4  80.0 -  100.0 fL Final   MCH 02/22/2024 28.4  26.0 - 34.0 pg Final   MCHC 02/22/2024 33.2  30.0 - 36.0 g/dL Final   RDW 91/87/7974 12.9  11.5 - 15.5 % Final   Platelets 02/22/2024 290  150 - 400 K/uL Final   nRBC 02/22/2024 0.0  0.0 - 0.2 % Final   Neutrophils Relative % 02/22/2024 69  % Final   Neutro Abs 02/22/2024 7.2  1.7 - 7.7 K/uL Final   Lymphocytes Relative 02/22/2024 18  % Final   Lymphs Abs 02/22/2024 1.8  0.7 - 4.0 K/uL Final   Monocytes Relative 02/22/2024 10  % Final   Monocytes Absolute 02/22/2024 1.1 (H)  0.1 - 1.0 K/uL Final   Eosinophils Relative 02/22/2024 2  % Final   Eosinophils Absolute 02/22/2024 0.2  0.0 - 0.5 K/uL Final   Basophils Relative 02/22/2024 0  % Final   Basophils Absolute 02/22/2024 0.0  0.0 - 0.1 K/uL Final   Immature Granulocytes 02/22/2024 1  % Final   Abs Immature Granulocytes 02/22/2024 0.05  0.00 - 0.07 K/uL Final   Performed at [facility], [address], [place], [place] [address]   Sodium 02/22/2024 141  135 - 145 mmol/L Final   Potassium 02/22/2024 3.5  3.5 - 5.1 mmol/L Final   Chloride 02/22/2024 103  98 - 111 mmol/L Final   CO2 02/22/2024 26  22 - 32 mmol/L Final   Glucose, Bld 02/22/2024 101 (H)  70 - 99 mg/dL Final   Glucose reference range applies only to samples taken after fasting for at least 8 hours.   BUN 02/22/2024 13  6 - 20 mg/dL Final   Creatinine, Ser 02/22/2024 1.08  0.61 - 1.24 mg/dL Final   Calcium 91/87/7974 9.0  8.9 - 10.3 mg/dL Final   Total Protein 91/87/7974 6.7  6.5 - 8.1 g/dL Final   Albumin 91/87/7974 3.9  3.5 - 5.0 g/dL Final   AST 91/87/7974 53 (H)  15 - 41 U/L Final   ALT 02/22/2024 65 (H)  0 - 44 U/L Final   Alkaline Phosphatase 02/22/2024 41  38 - 126 U/L Final   Total Bilirubin 02/22/2024 0.9  0.0 - 1.2 mg/dL Final   GFR, Estimated 02/22/2024 >60  >60 mL/min Final   Comment: (NOTE) Calculated using the CKD-EPI Creatinine Equation (2021)    Anion gap 02/22/2024 12  5 - 15 Final   Performed at [facility], [address] [place], [place] [address]   Hgb A1c MFr Bld 02/22/2024 6.0 (H)  4.8 - 5.6 % Final   Comment: (NOTE)         Prediabetes: 5.7 - 6.4         Diabetes: >6.4         Glycemic control for adults with diabetes: <7.0    Mean Plasma Glucose 02/22/2024 126  mg/dL Final   Comment: (NOTE) Performed At: [facility] [address] [place], [place] [id number] Jennette Shorter MD Ey:[id number]    Alcohol, Ethyl (B) 02/22/2024 <15  <15 mg/dL Final   Comment: (NOTE) For medical purposes only. Performed at [facility], [address], [place], [place] [address]    Magnesium  02/22/2024 2.1  1.7 - 2.4 mg/dL Final   Performed at [facility], [address], [place], [place] [address]   Cholesterol 02/22/2024 134  0 - 200 mg/dL Final   Triglycerides 91/87/7974 213 (H)  <150 mg/dL Final   HDL 91/87/7974 46  >40 mg/dL Final   Total CHOL/HDL Ratio 02/22/2024 2.9  RATIO Final   VLDL 02/22/2024 43 (H)  0 - 40 mg/dL Final   LDL Cholesterol 02/22/2024 45  0 - 99 mg/dL Final   Comment:        Total Cholesterol/HDL:CHD Risk Coronary Heart Disease Risk Table                     Men   Women  1/2 Average Risk   3.4   3.3  Average Risk       5.0   4.4  2 X Average Risk   9.6   7.1  3 X Average Risk  23.4   11.0        Use the calculated Patient Ratio above and the CHD Risk Table to determine the patient's CHD Risk.        ATP III CLASSIFICATION (LDL):  <100     mg/dL   Optimal  899-870  mg/dL   Near or Above                    Optimal  130-159  mg/dL   Borderline  839-810  mg/dL   High  >809     mg/dL   Very High Performed at [facility], [address], [place], [place] [address]    TSH 02/22/2024 2.674  0.350 - 4.500 uIU/mL Final   Comment: Performed by a 3rd Generation assay with a functional sensitivity of <=0.01 uIU/mL. Performed at [facility], [address] [place], [place] [address]    Color, Urine 02/22/2024 YELLOW  YELLOW  Final   APPearance 02/22/2024 CLEAR  CLEAR Final   Specific Gravity, Urine 02/22/2024 1.014  1.005 - 1.030 Final   pH 02/22/2024 5.0  5.0 - 8.0 Final   Glucose, UA 02/22/2024 NEGATIVE  NEGATIVE mg/dL Final   Hgb urine dipstick 02/22/2024 NEGATIVE  NEGATIVE Final   Bilirubin Urine 02/22/2024 NEGATIVE  NEGATIVE Final   Ketones, ur 02/22/2024 5 (A)  NEGATIVE mg/dL Final   Protein, ur 91/87/7974 30 (A)  NEGATIVE mg/dL Final   Nitrite 91/87/7974 NEGATIVE  NEGATIVE Final   Leukocytes,Ua 02/22/2024 NEGATIVE  NEGATIVE Final   RBC / HPF 02/22/2024 0-5  0 - 5 RBC/hpf Final   WBC, UA 02/22/2024 0-5  0 - 5 WBC/hpf Final   Bacteria, UA 02/22/2024 NONE SEEN  NONE SEEN Final   Squamous Epithelial / HPF 02/22/2024 0-5  0 - 5 /HPF Final   Mucus 02/22/2024 PRESENT   Final   Performed at [facility], [address], [place], [place] [address]   POC Amphetamine UR 02/22/2024 None Detected  NONE DETECTED (Cut Off Level 1000 ng/mL) Final   POC Secobarbital (BAR) 02/22/2024 None Detected  NONE DETECTED (Cut Off Level 300 ng/mL) Final   POC Buprenorphine (BUP) 02/22/2024 None Detected  NONE DETECTED (Cut Off Level 10 ng/mL) Final   POC Oxazepam (BZO) 02/22/2024 None Detected  NONE DETECTED (Cut Off Level 300 ng/mL) Final   POC Cocaine UR 02/22/2024 Positive (A)  NONE DETECTED (Cut Off Level 300 ng/mL) Final   POC Methamphetamine UR 02/22/2024 None Detected  NONE DETECTED (Cut Off Level 1000 ng/mL) Final   POC Morphine  02/22/2024 None Detected  NONE DETECTED (Cut Off Level 300 ng/mL) Final   POC Methadone UR 02/22/2024 None Detected  NONE DETECTED (Cut Off Level 300 ng/mL) Final   POC Oxycodone  UR 02/22/2024 None Detected  NONE DETECTED (Cut Off Level 100 ng/mL) Final   POC Marijuana UR 02/22/2024 Positive (A)  NONE DETECTED (Cut Off Level 50 ng/mL) Final  Admission on 02/21/2024, Discharged on 02/22/2024  Component Date Value Ref Range Status   Sodium 02/21/2024 140  135 - 145 mmol/L Final   Potassium  02/21/2024 3.8  3.5 - 5.1 mmol/L Final   Chloride 02/21/2024 103  98 - 111 mmol/L Final   CO2 02/21/2024 21 (L)  22 - 32 mmol/L Final   Glucose, Bld 02/21/2024 111 (H)  70 - 99 mg/dL Final   Glucose reference range applies only to samples taken after fasting for at least 8 hours.   BUN 02/21/2024 13  6 - 20 mg/dL Final   Creatinine, Ser 02/21/2024 1.25 (H)  0.61 - 1.24 mg/dL Final   Calcium 91/88/7974 9.2  8.9 - 10.3 mg/dL Final   GFR, Estimated 02/21/2024 >60  >60 mL/min Final   Comment: (NOTE) Calculated using the CKD-EPI Creatinine Equation (2021)    Anion gap 02/21/2024 16 (H)  5 - 15 Final   Performed at [facility], [address] [place], [place] [address]   WBC 02/21/2024 16.9 (H)  4.0 - 10.5 K/uL Final   RBC 02/21/2024 4.62  4.22 - 5.81 MIL/uL Final   Hemoglobin 02/21/2024 13.1  13.0 - 17.0 g/dL Final   HCT 91/88/7974 40.5  39.0 - 52.0 % Final   MCV 02/21/2024 87.7  80.0 - 100.0 fL Final   MCH 02/21/2024 28.4  26.0 - 34.0 pg Final   MCHC 02/21/2024 32.3  30.0 - 36.0 g/dL Final   RDW 91/88/7974 13.0  11.5 - 15.5 % Final   Platelets 02/21/2024 306  150 - 400 K/uL Final   nRBC 02/21/2024 0.0  0.0 - 0.2 % Final   Performed at [facility], [address], [place], [place] [address]   Troponin I (High Sensitivity) 02/21/2024 5  <18 ng/L Final   Comment: (NOTE) Elevated high sensitivity troponin I (hsTnI) values and significant  changes across serial measurements may suggest ACS but many other  chronic and acute conditions are known to elevate hsTnI results.  Refer to the Links section for chest pain algorithms and additional  guidance. Performed at [facility], [address] [place], [place] [address]    Troponin I (High Sensitivity) 02/21/2024 6  <18 ng/L Final   Comment: (NOTE)  Elevated high sensitivity troponin I (hsTnI) values and significant  changes across serial measurements may suggest ACS but many other  chronic and acute conditions are  known to elevate hsTnI results.  Refer to the Links section for chest pain algorithms and additional  guidance. Performed at [facility], [address] [place], [place] [address]     Blood Alcohol level:  Lab Results  Component Value Date   [facility] <15 02/22/2024   ETH <10 06/30/2023    Metabolic Disorder Labs: Lab Results  Component Value Date   HGBA1C 6.0 (H) 02/22/2024   MPG 126 02/22/2024   No results found for: PROLACTIN Lab Results  Component Value Date   CHOL 134 02/22/2024   TRIG 213 (H) 02/22/2024   HDL 46 02/22/2024   CHOLHDL 2.9 02/22/2024   VLDL 43 (H) 02/22/2024   LDLCALC 45 02/22/2024   LDLCALC 71 04/03/2010    Therapeutic Lab Levels: No results found for: LITHIUM No results found for: VALPROATE No results found for: CBMZ  Physical Findings   PHQ2-9    Flowsheet Row ED from 02/22/2024 in [facility] Most recent reading at 02/28/2024  3:17 PM ED from 06/30/2023 in [facility] Most recent reading at 07/01/2023  8:08 AM ED from 06/30/2023 in [facility] Most recent reading at 06/30/2023 10:47 PM  PHQ-2 Total Score 2 4 4   PHQ-9 Total Score 9 11 11    Flowsheet Row ED from 02/22/2024 in [facility] Most recent reading at 02/22/2024 10:41 AM ED from 02/22/2024 in [facility] Most recent reading at 02/22/2024  8:12 AM ED from 02/21/2024 in [facility] Emergency Department at [facility] Most recent reading at 02/21/2024  8:17 PM  C-SSRS RISK CATEGORY No Risk No Risk No Risk     Musculoskeletal  Strength & Muscle Tone: within normal limits Gait & Station: normal Patient leans: N/A  Psychiatric Specialty Exam  Presentation  General Appearance:  Fairly Groomed  Eye Contact: Fair  Speech: Slow  Speech Volume: Decreased  Handedness: Right   Mood and Affect   Mood: Dysphoric  Affect: Congruent   Thought Process  Thought Processes: Coherent  Descriptions of Associations:Intact  Orientation:Full (Time, Place and Person)  Thought Content:Logical  Diagnosis of Schizophrenia or Schizoaffective disorder in past: No    Hallucinations:Hallucinations: None  Ideas of Reference:None  Suicidal Thoughts:Suicidal Thoughts: No  Homicidal Thoughts:Homicidal Thoughts: No   Sensorium  Memory: Immediate Good; Recent Good; Remote Good  Judgment: Fair  Insight: Fair   Chartered certified accountant: Fair  Attention Span: Fair  Recall: Good  Fund of Knowledge: Good  Language: Good   Psychomotor Activity  Psychomotor Activity: Psychomotor Activity: Normal   Assets  Assets: Communication Skills; Desire for Improvement   Sleep  Sleep: Sleep: Fair  Estimated Sleeping Duration (Last 24 Hours): 9.00-10.75 hours  No data recorded  Physical Exam  Physical Exam Vitals and nursing note reviewed.  Constitutional:      Appearance: Normal appearance.  HENT:     Head: Normocephalic and atraumatic.     Nose: Nose normal.     Mouth/Throat:     Mouth: Mucous membranes are moist.  Eyes:     Pupils: Pupils are equal, round, and reactive to light.  Pulmonary:     Effort: Pulmonary effort is normal.  Musculoskeletal:        General: Normal range of motion.  Cervical back: Normal range of motion.  Skin:    General: Skin is warm and dry.  Neurological:     General: No focal deficit present.  Psychiatric:        Behavior: Behavior normal.    Review of Systems  Constitutional:  Positive for malaise/fatigue. Negative for chills, fever and weight loss.  HENT:  Negative for hearing loss.   Eyes:  Negative for blurred vision.  Respiratory:  Negative for cough.   Cardiovascular:  Negative for chest pain.  Gastrointestinal:  Negative for heartburn and nausea.  Genitourinary:  Negative for dysuria and urgency.   Musculoskeletal:  Negative for back pain and joint pain.  Neurological:  Positive for headaches.  Psychiatric/Behavioral:  Positive for depression and substance abuse. Negative for hallucinations and suicidal ideas. The patient has insomnia.    Blood pressure 127/84, pulse 100, temperature 98.4 F (36.9 C), temperature source Oral, resp. rate 18, SpO2 98%. There is no height or weight on file to calculate BMI.  Treatment Plan Summary: Daily contact with patient to assess and evaluate symptoms and progress in treatment and Medication management   Patient has significant psychiatric history but does not want to restart medications.  Patient is not not harm to self or others at this time.     Safety and Monitoring: Voluntary admission to inpatient psychiatric unit for safety, stabilization and treatment Daily contact with patient to assess and evaluate symptoms and progress in treatment Patient's case to be discussed in multi-disciplinary team meeting Observation Level : q15 minute checks Vital signs: q12 hours Precautions: Safety   Long Term Goal(s): Improvement in symptoms so as ready for discharge   Short Term Goals: Ability to identify changes in lifestyle to reduce recurrence of condition will improve, Ability to verbalize feelings will improve, Ability to disclose and discuss suicidal ideas, Ability to demonstrate self-control will improve, Ability to identify and develop effective coping behaviors will improve, Ability to maintain clinical measurements within normal limits will improve, Compliance with prescribed medications will improve, and Ability to identify triggers associated with substance abuse/mental health issues will improve   Psychiatric medications: -started lamictal  25mg  PO daily for bipolar disorder -added melatonin for sleep -stopped trazodone . Scheduled doxepin  10mg  PO at bedtime for sleep and anxiety    PRNS -Continue Tylenol  650 mg every 6 hours PRN for mild  pain -Continue Maalox 30 mg every 4 hrs PRN for indigestion -Continue Milk of Magnesia as needed every 6 hrs for constipation -Continue trazodone  once at bedtime for insomnia -Continue nicotine  gum as needed -Continue Ativan  1 mg for CIWA >10 -Continue ibuprofen  40 mg once every 6 hours as needed for headache and moderate pain -Continue Imodium  as needed for diarrhea -Continue hydroxy 25 mg as needed for anxiety Continue ondansetron  4 mg as needed for nausea and vomiting   Discharge Planning: Social work and case management to assist with discharge planning and identification of hospital follow-up needs prior to discharge Estimated LOS: 5-7 days Discharge Concerns: Need to establish a safety plan; Medication compliance and effectiveness Discharge Goals: Return home with outpatient referrals for mental health follow-up including medication management/psychotherapy  Garvin JINNY Gaines, MD 02/28/2024 3:23 PM

## 2024-02-28 NOTE — ED Notes (Signed)
 Stable. A&O x 4.  Discharging to home/self care to F/U with [facility].  Pt provided with 14 days of home medication and  30 day paper script.   Denies current SI plan and Intent.  Denies HI and A/V hallucinations.   All belongings returned to PT.  F/U instruction reviewed   Pt verbalized understanding

## 2024-02-28 NOTE — ED Notes (Signed)
 Patient is in a deep sleep snoring. No getting up noted even for bathroom.

## 2024-02-28 NOTE — Discharge Instructions (Signed)
 You are scheduled for admission to [facility] on 03/08/24 by 9AM and will need to bring your 30 day scripts and 7 day supply of medications.  You will need to follow up with admissions team at [facility] to confirm your place remaining on list for admission.      Residential Treatment  Facilities Medicaid Detox No Insurance Engineer, site ([facility]) [address] [place], [place] [phone] or [phone]   No  Yes  Yes  Yes

## 2024-02-28 NOTE — Discharge Planning (Signed)
 SW spoke with Emmie Caldron at [facility] and they approved patient but later learned he had Medicare primary and could not accept. Patient is choosing to Dc home to his mother's house today and will follow up with placement at [facility] where he has a bed available for admission on 8/27. SW will provide information with patients AVS. Cab voucher will be provided for transport to mothers home. 30 day scripts and 14 day supply of medications will be provided to patient upon DC for [facility] admission requirements.

## 2024-02-28 NOTE — ED Notes (Signed)
 Pt observed lying in bed. Eyes closed respirations even and non labored. NAD q 15 minute observations continue for safety.

## 2024-02-28 NOTE — ED Notes (Signed)
 Pt requesting for discharge.  Pt has spoken with social worker and in aware of discharge plan.  Denied current SI plan and intent.  Denied HI and A/V hallucinations.  No needs identified at present.    15 minute observation continue for safety
